# Patient Record
Sex: Female | Born: 1960
Health system: Southern US, Community
[De-identification: ages and names within clinical notes are randomized; demographics above are authoritative.]

## PROBLEM LIST (undated history)

## (undated) DIAGNOSIS — O021 Missed abortion: Secondary | ICD-10-CM

## (undated) DIAGNOSIS — Z923 Personal history of irradiation: Secondary | ICD-10-CM

## (undated) DIAGNOSIS — O009 Unspecified ectopic pregnancy without intrauterine pregnancy: Secondary | ICD-10-CM

## (undated) DIAGNOSIS — I10 Essential (primary) hypertension: Secondary | ICD-10-CM

## (undated) DIAGNOSIS — E079 Disorder of thyroid, unspecified: Secondary | ICD-10-CM

## (undated) DIAGNOSIS — E349 Endocrine disorder, unspecified: Secondary | ICD-10-CM

## (undated) DIAGNOSIS — K219 Gastro-esophageal reflux disease without esophagitis: Secondary | ICD-10-CM

## (undated) DIAGNOSIS — IMO0001 Reserved for inherently not codable concepts without codable children: Secondary | ICD-10-CM

## (undated) HISTORY — DX: Reserved for inherently not codable concepts without codable children: IMO0001

## (undated) HISTORY — PX: BREAST LUMPECTOMY: SHX2

## (undated) HISTORY — DX: Endocrine disorder, unspecified: E34.9

## (undated) HISTORY — DX: Gastro-esophageal reflux disease without esophagitis: K21.9

## (undated) HISTORY — PX: TUBAL LIGATION: SHX77

## (undated) HISTORY — DX: Unspecified ectopic pregnancy without intrauterine pregnancy: O00.90

## (undated) HISTORY — DX: Disorder of thyroid, unspecified: E07.9

## (undated) HISTORY — DX: Essential (primary) hypertension: I10

## (undated) HISTORY — PX: DILATION AND CURETTAGE OF UTERUS: SHX78

## (undated) HISTORY — DX: Missed abortion: O02.1

---

## 1985-01-02 HISTORY — PX: SALPINGECTOMY: SHX328

## 1986-01-02 HISTORY — PX: ENTEROLYSIS: SUR452

## 2001-01-22 ENCOUNTER — Encounter: Admission: RE | Admit: 2001-01-22 | Discharge: 2001-01-22 | Payer: Self-pay | Admitting: Family Medicine

## 2001-01-22 ENCOUNTER — Encounter: Payer: Self-pay | Admitting: Family Medicine

## 2002-04-22 ENCOUNTER — Other Ambulatory Visit: Admission: RE | Admit: 2002-04-22 | Discharge: 2002-04-22 | Payer: Self-pay | Admitting: Gynecology

## 2002-07-16 ENCOUNTER — Encounter: Admission: RE | Admit: 2002-07-16 | Discharge: 2002-10-14 | Payer: Self-pay | Admitting: Gynecology

## 2002-11-25 ENCOUNTER — Inpatient Hospital Stay (HOSPITAL_COMMUNITY): Admission: AD | Admit: 2002-11-25 | Discharge: 2002-11-25 | Payer: Self-pay | Admitting: Gynecology

## 2002-11-28 ENCOUNTER — Inpatient Hospital Stay (HOSPITAL_COMMUNITY): Admission: AD | Admit: 2002-11-28 | Discharge: 2002-11-28 | Payer: Self-pay | Admitting: Gynecology

## 2002-12-11 ENCOUNTER — Inpatient Hospital Stay (HOSPITAL_COMMUNITY): Admission: RE | Admit: 2002-12-11 | Discharge: 2002-12-14 | Payer: Self-pay | Admitting: Gynecology

## 2002-12-11 ENCOUNTER — Encounter (INDEPENDENT_AMBULATORY_CARE_PROVIDER_SITE_OTHER): Payer: Self-pay | Admitting: Specialist

## 2002-12-15 ENCOUNTER — Encounter: Admission: RE | Admit: 2002-12-15 | Discharge: 2003-01-14 | Payer: Self-pay | Admitting: Gynecology

## 2003-01-15 ENCOUNTER — Encounter: Admission: RE | Admit: 2003-01-15 | Discharge: 2003-02-14 | Payer: Self-pay | Admitting: Gynecology

## 2003-01-21 ENCOUNTER — Other Ambulatory Visit: Admission: RE | Admit: 2003-01-21 | Discharge: 2003-01-21 | Payer: Self-pay | Admitting: Gynecology

## 2003-02-15 ENCOUNTER — Encounter: Admission: RE | Admit: 2003-02-15 | Discharge: 2003-03-17 | Payer: Self-pay | Admitting: Gynecology

## 2003-04-15 ENCOUNTER — Encounter: Admission: RE | Admit: 2003-04-15 | Discharge: 2003-05-15 | Payer: Self-pay | Admitting: Gynecology

## 2003-06-15 ENCOUNTER — Encounter: Admission: RE | Admit: 2003-06-15 | Discharge: 2003-07-15 | Payer: Self-pay | Admitting: Gynecology

## 2003-08-15 ENCOUNTER — Encounter: Admission: RE | Admit: 2003-08-15 | Discharge: 2003-09-14 | Payer: Self-pay | Admitting: Gynecology

## 2003-09-15 ENCOUNTER — Encounter: Admission: RE | Admit: 2003-09-15 | Discharge: 2003-10-15 | Payer: Self-pay | Admitting: Gynecology

## 2005-07-31 ENCOUNTER — Other Ambulatory Visit: Admission: RE | Admit: 2005-07-31 | Discharge: 2005-07-31 | Payer: Self-pay | Admitting: Gynecology

## 2006-04-04 ENCOUNTER — Encounter: Admission: RE | Admit: 2006-04-04 | Discharge: 2006-04-04 | Payer: Self-pay | Admitting: Family Medicine

## 2007-05-13 ENCOUNTER — Encounter: Admission: RE | Admit: 2007-05-13 | Discharge: 2007-05-13 | Payer: Self-pay | Admitting: Family Medicine

## 2009-03-03 ENCOUNTER — Other Ambulatory Visit: Admission: RE | Admit: 2009-03-03 | Discharge: 2009-03-03 | Payer: Self-pay | Admitting: Gynecology

## 2009-03-03 ENCOUNTER — Ambulatory Visit: Payer: Self-pay | Admitting: Gynecology

## 2010-05-20 NOTE — Discharge Summary (Signed)
NAME:  Margaret Best, Margaret Best                    ACCOUNT NO.:  [id number]   MEDICAL RECORD NO.:  [id number]                   PATIENT TYPE:  INP   LOCATION:  9147                                 [facility]ILITY[facility]   PHYSICIAN:  Juan H. Lily Peer, M.D.             DATE OF BIRTH:  07/03/1960   DATE OF ADMISSION:  12/11/2002  DATE OF DISCHARGE:  12/14/2002                                 DISCHARGE SUMMARY   DISCHARGE DIAGNOSES:  1. Intrauterine pregnancy 37 weeks, delivered.  2. History of two prior cesarean sections for repeat cesarean section.  3. History of preeclampsia with abruptio placenta at 38 weeks with     intrauterine fetal demise.  4. Hypothyroidism.  5. Pregnancy induced hypertension.  6. Desired attempt at permanent sterilization.  7. Status post repeat low transverse cesarean section, right tubal     sterilization, modified Pomeroy technique by Dr. Colin Broach on     December 11, 2002.   HISTORY:  [name], para 1 (lost one female at 79  weeks) whose prenatal course has been complicated by history of preeclampsia  with the first pregnancy and abruptio placenta with a fetal demise at 45  weeks.  Subsequently, a successful pregnancy and also this pregnancy  developed pregnancy induced hypertension with blood pressure in the 140-  150/80-90 range.  She had a history of two prior cesarean sections for  repeat cesarean section.  Also desired attempt at permanent sterilization.  Note, past history of an ectopic pregnancy and history of hypothyroidism.  The patient had been followed in pregnancy with antepartum testing, biweekly  NSTs, and weekly AFIs.   HOSPITAL COURSE:  On December 11, 2002 patient was admitted.  Underwent a  repeat low transverse cesarean section, right tubal sterilization, modified  Pomeroy technique and underwent delivery of a female, Apgars 8 and 9, weight 7  pounds 6 ounces.  There were no complications.  There was noted to be absent  left fallopian tube.  Postoperatively patient remained afebrile, voiding,  stable condition.  She was discharged to home on December 14, 2002 and given  [facility] Gynecology postpartum instruction/postpartum booklet.   ACCESSORY CLINICAL FINDINGS:  Laboratories:  The patient is O+.  Rubella  immune.  On December 12, 2002 hemoglobin was 11.2.   DISPOSITION:  The patient was discharged to home.  Return to office six  weeks.  Given prescription for Tylox p.r.n. pain.     Susa Loffler, P.A.                    Juan H. Lily Peer, M.D.    TSG/MEDQ  D:  01/12/2003  T:  01/12/2003  Job:  [id number]

## 2010-05-20 NOTE — Op Note (Signed)
NAME:  Margaret Best, Margaret Best                    ACCOUNT NO.:  [id number]   MEDICAL RECORD NO.:  [id number]                   PATIENT TYPE:  MAT   LOCATION:  [facility]C                                 [facility]:  [facility]   PHYSICIAN:  Timothy P. Fontaine, M.D.           DATE OF BIRTH:  May 15, 1960   DATE OF PROCEDURE:  12/11/2002  DATE OF DISCHARGE:  11/25/2002                                 OPERATIVE REPORT   PREOPERATIVE DIAGNOSES:  1. Pregnancy at 37 weeks.  2. Pregnancy-induced hypertension.  3. History of fetal demise at term.  4. Cesarean section x2, desires repeat cesarean section.  5. Desires permanent sterilization.   POSTOPERATIVE DIAGNOSES:  1. Pregnancy at 37 weeks.  2. Pregnancy-induced hypertension.  3. History of fetal demise at term.  4. Cesarean section x2, desires repeat cesarean section.  5. Desires permanent sterilization.   PROCEDURES:  1. Repeat low transverse cervical cesarean section.  2. Right tubal sterilization, modified Pomeroy technique.   SURGEON:  Timothy P. Fontaine, M.D.   ASSISTANT:  Ivor Costa. Farrel Gobble, M.D.   ANESTHESIA:  Spinal.   ESTIMATED BLOOD LOSS:  Less than 500 mL.   COMPLICATIONS:  None.   SPECIMENS:  1. Portion of right fallopian tube.  2. Placenta.  3. Samples of cord blood.   FINDINGS:  At 1215 normal female infant, Apgars 8 and 9, weight 7 pounds 6  ounces.  Pelvic anatomy noted to be normal with the exception of an absent  left fallopian tube.   DESCRIPTION OF PROCEDURE:  The patient was taken to the operating room and  underwent spinal anesthesia, was placed in the left tilt supine position,  and received an abdominal preparation with Betadine solution, bladder  emptied with indwelling Foley catheterization placed in sterile technique.  After assuring adequate anesthesia, the abdomen was sharply entered through  a repeat Pfannenstiel incision, achieving adequate hemostasis at all levels.  The bladder flap was sharply and bluntly  developed without difficulty and  the uterus was sharply entered in the lower uterine segment with bulging  membrane noted.  The incision bluntly extended laterally and the membranes  ruptured.  The infant's head was delivered through the incision, he nares  and mouth suctioned, the rest of the infant delivered, the cord doubly  clamped and cut, the infant handed to pediatrics in attendance.  Samples of  cord blood were then obtained.  The placenta was then spontaneously extruded  and noted to be intact.  The uterus was exteriorized and the endometrial  cavity was explored with a sponge to remove all placental and membrane  fragments.  The uterine incision was closed in one layer using 0 Vicryl  suture in a running interlocking stitch.  Two figure-of-eight interrupted  sutures were placed to achieve ultimate hemostasis along the suture line.  The adnexa were then examined.  The right and left ovaries were noted to be  normal.  There was no evidence of a left fallopian  tube.  The right  fallopian tube was normal in appearance, and a midsection was grasped with  Babcock clamp and the mid-tubal segment was doubly ligated using 0 plain  suture, the intervening segment excised.  The tubal lumen as well as  adequate hemostasis was grossly visualized.  The uterus was returned to the  abdomen, which was copiously irrigated showing adequate hemostasis, and the  anterior fascia was reapproximated using 0 Vicryl suture in a running  stitch.  The subcutaneous tissues were irrigated showing adequate hemostasis  with electrocautery, and the skin was reapproximated using 4-0 Vicryl in a  running subcuticular stitch.  Steri-Strips and Benzoin applied, a sterile  dressing applied.  The patient was taken to the recovery room in good  condition, having tolerated the procedure well.  Of note, the patient's  linea nigra did not approximate at the incision line, which was noted  preoperatively from the prior  surgeries and initial attempts to  reapproximate the linea would have distorted the incision significantly, and  the incision was closed as it was found with the linea malapproximated.                                               Timothy P. Audie Box, M.D.    TPF/MEDQ  D:  12/11/2002  T:  12/11/2002  Job:  [id number]

## 2010-05-20 NOTE — H&P (Signed)
NAME:  Margaret Best, Margaret Best                    ACCOUNT NO.:  [id number]   MEDICAL RECORD NO.:  [id number]                   PATIENT TYPE:  INP   LOCATION:  NA                                   [facility]   PHYSICIAN:  Timothy P. Fontaine, M.D.           DATE OF BIRTH:  September 30, 1960   DATE OF ADMISSION:  12/11/2002  DATE OF DISCHARGE:                                HISTORY & PHYSICAL   CHIEF COMPLAINT:  1. Pregnancy at 37 weeks.  2. Two prior cesarean sections, for repeat cesarean section.  3. History of pre-eclampsia with abruptio placentae at 38 weeks with     intrauterine fetal demise.  4. Hypothyroidism.  5. Pregnancy induced hypertension.  6. Desires permanent sterilization.   HISTORY OF PRESENT ILLNESS:  A 50 year old G5, P1, lost 1 female at 81  weeks. History of pre-eclampsia with her first pregnancy and abruptio  placenta, leading to fetal demise at 38 weeks. Subsequent successful  pregnancy who now is at 37 weeks. Has developed pregnancy induced  hypertension with blood pressure's in the 140 to 150 over 80 to 90 range,  who is admitted at this time for a repeat cesarean section and tubal  sterilization. The patient had done well through the pregnancy. Was noted to  have elevated blood pressures at approximately 28 weeks, which have  continued through term with recordings in the 130 over 100 range, rest  leading to 140's over 80's to 90's. Has undergone antepartum testing with bi-  weekly NST's, weekly AFI's, who now is admitted for her delivery. See the  [facility] for the remaining of her history.   PHYSICAL EXAMINATION:  HEENT:  Normal.  LUNGS:  Clear.  CARDIAC:  Regular rate without rubs, murmurs or gallops.  ABDOMEN:  Gravid, vertex fetus. Appropriate for term. Positive fetal heart  tones. Reactive NST.  PELVIC:  Deferred.   ASSESSMENT:  A 50 year old patient with history of severe pre-eclampsia with  abruptio placentae at term with fetal loss. Subsequent  success at 38 weeks  with repeat cesarean section who is now admitted for repeat cesarean section  and tubal sterilization. The risks, benefits, indications and alternatives  for the procedure were reviewed with her and I discussed the bleeding,  transfusion, infection, wound complications requiring opening and draining  of incisions, closure by secondary intention, damage to internal organs  including bowel, bladder, ureters, vessels and nerves and necessitating  major exploratory reparative surgeries and future reparative surgeries  including ostomy formation. The permanency of tubal sterilization was  discussed with her as well as the possibilities of failure and she  understands and accepts this. I also discussed with her that she is at 37  weeks and the pros and cons of continued antepartum management with  antepartum surveillance versus cesarean section now were reviewed and given  her past history, I think it most reasonable to proceed now with delivery.  The patient does understand that there still can be  issues with prematurity.  She understands and accepts this but wants to proceed with surgery now. The  patient's questions are answered to her satisfaction and she is ready to  proceed with surgery.                                              Timothy P. Audie Box, M.D.   TPF/MEDQ  D:  12/09/2002  T:  12/09/2002  Job:  [id number]

## 2010-11-30 ENCOUNTER — Other Ambulatory Visit: Payer: Self-pay | Admitting: Plastic Surgery

## 2012-01-25 ENCOUNTER — Ambulatory Visit (INDEPENDENT_AMBULATORY_CARE_PROVIDER_SITE_OTHER): Payer: BC Managed Care – PPO | Admitting: Gynecology

## 2012-01-25 ENCOUNTER — Encounter: Payer: Self-pay | Admitting: Gynecology

## 2012-01-25 VITALS — BP 136/92

## 2012-01-25 DIAGNOSIS — I10 Essential (primary) hypertension: Secondary | ICD-10-CM | POA: Insufficient documentation

## 2012-01-25 DIAGNOSIS — N951 Menopausal and female climacteric states: Secondary | ICD-10-CM

## 2012-01-25 DIAGNOSIS — N938 Other specified abnormal uterine and vaginal bleeding: Secondary | ICD-10-CM | POA: Insufficient documentation

## 2012-01-25 DIAGNOSIS — N949 Unspecified condition associated with female genital organs and menstrual cycle: Secondary | ICD-10-CM

## 2012-01-25 DIAGNOSIS — E039 Hypothyroidism, unspecified: Secondary | ICD-10-CM | POA: Insufficient documentation

## 2012-01-25 LAB — FOLLICLE STIMULATING HORMONE: FSH: 8.1 m[IU]/mL

## 2012-01-25 LAB — CBC WITH DIFFERENTIAL/PLATELET
Basophils Absolute: 0 10*3/uL (ref 0.0–0.1)
Basophils Relative: 0 % (ref 0–1)
Eosinophils Absolute: 0 10*3/uL (ref 0.0–0.7)
Eosinophils Relative: 1 % (ref 0–5)
HCT: 44.6 % (ref 36.0–46.0)
Hemoglobin: 15.3 g/dL — ABNORMAL HIGH (ref 12.0–15.0)
Lymphocytes Relative: 22 % (ref 12–46)
Lymphs Abs: 1.6 10*3/uL (ref 0.7–4.0)
MCH: 30.7 pg (ref 26.0–34.0)
MCHC: 34.3 g/dL (ref 30.0–36.0)
MCV: 89.6 fL (ref 78.0–100.0)
Monocytes Absolute: 0.4 10*3/uL (ref 0.1–1.0)
Monocytes Relative: 5 % (ref 3–12)
Neutro Abs: 5.2 10*3/uL (ref 1.7–7.7)
Neutrophils Relative %: 72 % (ref 43–77)
Platelets: 298 10*3/uL (ref 150–400)
RBC: 4.98 MIL/uL (ref 3.87–5.11)
RDW: 13.3 % (ref 11.5–15.5)
WBC: 7.2 10*3/uL (ref 4.0–10.5)

## 2012-01-25 LAB — TSH: TSH: 1.815 u[IU]/mL (ref 0.350–4.500)

## 2012-01-25 MED ORDER — MEGESTROL ACETATE 40 MG PO TABS: 40.0000 mg | ORAL_TABLET | Freq: Two times a day (BID) | ORAL | Status: DC

## 2012-01-25 NOTE — Patient Instructions (Addendum)

## 2012-01-25 NOTE — Progress Notes (Signed)
Patient is a 52 year old gravida 3 para 2 Ab1 (tubal sterilization procedure) who has not been seen the office since 2011. The reason for today's visit is to do her recent dysfunction uterine bleeding episodes. She stated her last normal menstrual cycle was in August 2013. She stated she skipped the month of September. She had a normal cycle in October and November although the November cycle was light. She had some minimal spotting in December when she had a vaginal infection. She had a light period started at the end of December which ended January 6. 10 days later and up to today she has been bleeding every day. Patient does have history of hypertension and hypothyroidism and is being followed by Dr. Duane Lope at [facility]. Her last thyroid function tests were in March of 2013. Patient currently denies any other symptoms.  Review of her record indicated she has had in the past 8 history of left ectopic pregnancy. She also had toxemia (intrauterine fetal demise at 8-1/2 months gestation for/IUGR) resulting in cesarean section. She had a repeat cesarean section at term in 1993. She's also had a history of miscarriage first trimester as well. Her records also indicated in 1988 she had small bowel obstruction and had enterolysis. She also had an ectopic pregnancy resulted in a left salpingectomy in 1987. She had her tubes tied at the last C-section.  Exam: Abdomen: Soft nontender no rebound or guarding Pelvic: Bartholin urethra Skene was within normal limits Vagina: Blood was present in the vaginal vault Cervix: Blood was seen at the external os Uterus: Anteverted normal size shape and consistency Adnexa: No palpable masses or tenderness Rectal exam not done  Patient was counseled for an endometrial biopsy to obtain tissue specimen for histological evaluation as part of her workup. The cervix was cleansed with Betadine solution. A single-tooth tenaculum was then placed on the anterior  cervical lip. The uterus sounded to 7-1/2 cm. A Pipelle was introduced sterilely and moderate amount of tissue was obtained for pathological evaluation. The single-tooth tenaculum was removed. Silver nitrate was utilized to stop some light bleeding from the single-tooth tenaculum prong site.  Assessment/plan: Perimenopausal patient with dysfunctional uterine bleeding. Endometrial biopsy done today resulting in time of this dictation. Patient will be prescribed Megace 40 mg twice a day for 10 days to stop her bleeding. She will return back to the office next week for sonohysterogram to complete evaluation. A CBC, FSH and TSH was obtained today.

## 2012-02-02 ENCOUNTER — Ambulatory Visit (INDEPENDENT_AMBULATORY_CARE_PROVIDER_SITE_OTHER): Payer: BC Managed Care – PPO

## 2012-02-02 ENCOUNTER — Ambulatory Visit (INDEPENDENT_AMBULATORY_CARE_PROVIDER_SITE_OTHER): Payer: BC Managed Care – PPO | Admitting: Gynecology

## 2012-02-02 DIAGNOSIS — N949 Unspecified condition associated with female genital organs and menstrual cycle: Secondary | ICD-10-CM

## 2012-02-02 DIAGNOSIS — N951 Menopausal and female climacteric states: Secondary | ICD-10-CM

## 2012-02-02 DIAGNOSIS — N938 Other specified abnormal uterine and vaginal bleeding: Secondary | ICD-10-CM

## 2012-02-02 NOTE — Progress Notes (Signed)
Patient is a 52 year old gravida 3 para 2 AB 1 (prior tubal sterilization procedure) who was seen the office on 01/25/2012 as a result of dysfunction uterine bleeding see previous dictation office visit for details. Patient is not having any vasomotor symptoms there was having menstrual cycles are regular until recently as described on last office note. She underwent an endometrial biopsy at that visit with the following result:  Endometrium, biopsy PROLIFERATIVE-TYPE ENDOMETRIUM WITH BREAKDOWN.  She had a normal CBC a normal TSH and a normal FSH.  She presented to the office today for sonohysterogram. Sonohysterogram results as follows: Uterus measured 9.1 x 6.1 x 4.1 cm with endometrial stripe of 4.3 mm. A single small fibroid measured 13 x 9 mm was noted. Ovaries were normal bilateral. No fluid was seen in the cul-de-sac. Sonohysterogram with no evidence of any intrauterine defects.  Assessment/plan: Single isolated event of dysfunction uterine bleeding. Patient with history of hypothyroidism was recently tested and was found to be euthyroid with normal TSH. (Patient on Synthroid 75 mcg daily). We'll check her prolactin level today. She will maintain menstrual calendar for the next 3 months and return to the office the first week of May for her annual gynecological examination. She will come in a fasting state and we'll do her lab work to include a repeat FSH. Patient several months before this dysfunctional uterine bleeding episode was under a lot of stress. I reassured her that we can continue to monitor and rediscuss and monitor her between now and her annual exam in 4 months.

## 2012-02-02 NOTE — Patient Instructions (Addendum)
Perimenopause Perimenopause is the time when your body begins to move into the menopause (no menstrual period for 12 straight months). It is a natural process. Perimenopause can begin 2 to 8 years before the menopause and usually lasts for one year after the menopause. During this time, your ovaries may or may not produce an egg. The ovaries vary in their production of estrogen and progesterone hormones each month. This can cause irregular menstrual periods, difficulty in getting pregnant, vaginal bleeding between periods and uncomfortable symptoms. CAUSES  Irregular production of the ovarian hormones, estrogen and progesterone, and not ovulating every month.  Other causes include:  Tumor of the pituitary gland in the brain.  Medical disease that affects the ovaries.  Radiation treatment.  Chemotherapy.  Unknown causes.  Heavy smoking and excessive alcohol intake can bring on perimenopause sooner. SYMPTOMS   Hot flashes.  Night sweats.  Irregular menstrual periods.  Decrease sex drive.  Vaginal dryness.  Headaches.  Mood swings.  Depression.  Memory problems.  Irritability.  Tiredness.  Weight gain.  Trouble getting pregnant.  The beginning of losing bone cells (osteoporosis).  The beginning of hardening of the arteries (atherosclerosis). DIAGNOSIS  Your caregiver will make a diagnosis by analyzing your age, menstrual history and your symptoms. They will do a physical exam noting any changes in your body, especially your female organs. Female hormone tests may or may not be helpful depending on the amount and when you produce the female hormones. However, other hormone tests may be helpful (ex. thyroid hormone) to rule out other problems. TREATMENT  The decision to treat during the perimenopause should be made by you and your caregiver depending on how the symptoms are affecting you and your life style. There are various treatments available such as:  Treating  individual symptoms with a specific medication for that symptom (ex. tranquilizer for depression).  Herbal medications that can help specific symptoms.  Counseling.  Group therapy.  No treatment. HOME CARE INSTRUCTIONS   Before seeing your caregiver, make a list of your menstrual periods (when the occur, how heavy they are, how long between periods and how long they last), your symptoms and when they started.  Take the medication as recommended by your caregiver.  Sleep and rest.  Exercise.  Eat a diet that contains calcium (good for your bones) and soy (acts like estrogen hormone).  Do not smoke.  Avoid alcoholic beverages.  Taking vitamin E may help in certain cases.  Take calcium and vitamin D supplements to help prevent bone loss.  Group therapy is sometimes helpful.  Acupuncture may help in some cases. SEEK MEDICAL CARE IF:   You have any of the above and want to know if it is perimenopause.  You want advice and treatment for any of your symptoms mentioned above.  You need a referral to a specialist (gynecologist, psychiatrist or psychologist). SEEK IMMEDIATE MEDICAL CARE IF:   You have vaginal bleeding.  Your period lasts longer than 8 days.  You periods are recurring sooner than 21 days.  You have bleeding after intercourse.  You have severe depression.  You have pain when you urinate.  You have severe headaches.  You develop vision problems. Document Released: 01/27/2004 Document Revised: 03/13/2011 Document Reviewed: 10/17/2007 [employer] Patient Information 2013 [employer].  

## 2012-02-03 LAB — PROLACTIN: Prolactin: 10.4 ng/mL

## 2012-02-17 ENCOUNTER — Other Ambulatory Visit: Payer: Self-pay

## 2012-05-06 ENCOUNTER — Encounter: Payer: BC Managed Care – PPO | Admitting: Gynecology

## 2012-11-07 ENCOUNTER — Other Ambulatory Visit: Payer: Self-pay

## 2013-01-31 ENCOUNTER — Encounter: Payer: BC Managed Care – PPO | Admitting: Gynecology

## 2013-11-03 ENCOUNTER — Encounter: Payer: Self-pay | Admitting: Gynecology

## 2015-07-08 ENCOUNTER — Encounter: Payer: Self-pay | Admitting: Gynecology

## 2015-07-08 ENCOUNTER — Ambulatory Visit (INDEPENDENT_AMBULATORY_CARE_PROVIDER_SITE_OTHER): Payer: BLUE CROSS/BLUE SHIELD | Admitting: Gynecology

## 2015-07-08 VITALS — BP 136/84 | Ht 63.5 in | Wt 168.0 lb

## 2015-07-08 DIAGNOSIS — R6882 Decreased libido: Secondary | ICD-10-CM | POA: Diagnosis not present

## 2015-07-08 DIAGNOSIS — N950 Postmenopausal bleeding: Secondary | ICD-10-CM

## 2015-07-08 DIAGNOSIS — Z01419 Encounter for gynecological examination (general) (routine) without abnormal findings: Secondary | ICD-10-CM

## 2015-07-08 NOTE — Progress Notes (Signed)
Margaret Best 1960-09-21 [id number]   History:    55 y.o.  for annual gyn exam who has not been seen the office in over 3 years. Patient stated she has not had a menstrual cycle in over 2 years. Is not having any vasomotor symptoms except decreased libido. She does suffer from vaginal dryness and discomfort during intercourse. She states that every 3 or 4 month she'll have some brownish discharge and she's noted vaginally like dark blood. She has not seen anyone for this. Patient with no past history of any abnormal Pap smears. Patient has never had a colonoscopy and states that she rather not have one. Patient states that her primary care physician in [place]. [place], [place] has recently done her blood work is been monitor her thyroid since she has history of hypothyroidism and stated that he he had done an [facility] and she was in the menopausal range. She has never been on hormone replacement therapy.  Past medical history,surgical history, family history and social history were all reviewed and documented in the EPIC chart.  Gynecologic History Patient's last menstrual period was 01/20/2012. Contraception: post menopausal status Last Pap: 2011. Results were: normal Last mammogram: 2009. Results were: normal  Obstetric History OB History  Gravida Para Term Preterm AB SAB TAB Ectopic Multiple Living  3 2   1   1  2     # Outcome Date GA Lbr Len/2nd Weight Sex Delivery Anes PTL Lv  3 Ectopic           2 Para           1 Para                ROS: A ROS was performed and pertinent positives and negatives are included in the history.  GENERAL: No fevers or chills. HEENT: No change in vision, no earache, sore throat or sinus congestion. NECK: No pain or stiffness. CARDIOVASCULAR: No chest pain or pressure. No palpitations. PULMONARY: No shortness of breath, cough or wheeze. GASTROINTESTINAL: No abdominal pain, nausea, vomiting or diarrhea, melena or bright red blood per rectum. GENITOURINARY:  No urinary frequency, urgency, hesitancy or dysuria. MUSCULOSKELETAL: No joint or muscle pain, no back pain, no recent trauma. DERMATOLOGIC: No rash, no itching, no lesions. ENDOCRINE: No polyuria, polydipsia, no heat or cold intolerance. No recent change in weight. HEMATOLOGICAL: No anemia or easy bruising or bleeding. NEUROLOGIC: No headache, seizures, numbness, tingling or weakness. PSYCHIATRIC: No depression, no loss of interest in normal activity or change in sleep pattern.     Exam: chaperone present  BP 136/84 mmHg  Ht 5' 3.5" (1.613 m)  Wt 168 lb (76.204 kg)  BMI 29.29 kg/m2  LMP 01/20/2012  Body mass index is 29.29 kg/(m^2).  General appearance : Well developed well nourished female. No acute distress HEENT: Eyes: no retinal hemorrhage or exudates,  Neck supple, trachea midline, no carotid bruits, no thyroidmegaly Lungs: Clear to auscultation, no rhonchi or wheezes, or rib retractions  Heart: Regular rate and rhythm, no murmurs or gallops Breast:Examined in sitting and supine position were symmetrical in appearance, no palpable masses or tenderness,  no skin retraction, no nipple inversion, no nipple discharge, no skin discoloration, no axillary or supraclavicular lymphadenopathy Abdomen: no palpable masses or tenderness, no rebound or guarding Extremities: no edema or skin discoloration or tenderness  Pelvic:  Bartholin, Urethra, Skene Glands: Within normal limits             Vagina: No gross lesions  or discharge  Cervix: No gross lesions or discharge  Uterus  anteverted, normal size, shape and consistency, non-tender and mobile  Adnexa  Without masses or tenderness  Anus and perineum  normal   Rectovaginal  normal sphincter tone without palpated masses or tenderness             Hemoccult cards will be provided as well as recommend have a colonoscopy     Assessment/Plan:  55 y.o. female for annual exam with postmenopausal bleeding complaining of vaginal dryness and  dyspareunia and decreased libido. We are going to request a copy of the labs were done recently by her PCP. Pap smear with HPV screening was done today. Because of her postmenopausal bleeding she was counseled and her cervix was cleansed with Betadine solution. A single-tooth tenaculum was placed on the anterior cervical lip. The cervix required dilatation. The uterus sounded to 7 cm. Some tissue was obtained which was made of histological evaluation. Patient will return to the office next week for sonohysterogram as part of her evaluation for postmenopausal bleeding. Literature information on hormonal replacement therapy and the menopause was provided. I've also given the name of gastric urologist her to schedule her colonoscopy as well as her mammogram.   Terrance Mass MD, 4:26 PM 07/08/2015

## 2015-07-08 NOTE — Patient Instructions (Signed)
Hormone Therapy At menopause, your body begins making less estrogen and progesterone hormones. This causes the body to stop having menstrual periods. This is because estrogen and progesterone hormones control your periods and menstrual cycle. A lack of estrogen may cause symptoms such as:  Hot flushes (or hot flashes).  Vaginal dryness.  Dry skin.  Loss of sex drive.  Risk of bone loss (osteoporosis). When this happens, you may choose to take hormone therapy to get back the estrogen lost during menopause. When the hormone estrogen is given alone, it is usually referred to as ET (Estrogen Therapy). When the hormone progestin is combined with estrogen, it is generally called HT (Hormone Therapy). This was formerly known as hormone replacement therapy (HRT). Your caregiver can help you make a decision on what will be best for you. The decision to use HT seems to change often as new studies are done. Many studies do not agree on the benefits of hormone replacement therapy. LIKELY BENEFITS OF HT INCLUDE PROTECTION FROM:  Hot Flushes (also called hot flashes) - A hot flush is a sudden feeling of heat that spreads over the face and body. The skin may redden like a blush. It is connected with sweats and sleep disturbance. Women going through menopause may have hot flushes a few times a month or several times per day depending on the woman.  Osteoporosis (bone loss) - Estrogen helps guard against bone loss. After menopause, a woman's bones slowly lose calcium and become weak and brittle. As a result, bones are more likely to break. The hip, wrist, and spine are affected most often. Hormone therapy can help slow bone loss after menopause. Weight bearing exercise and taking calcium with vitamin D also can help prevent bone loss. There are also medications that your caregiver can prescribe that can help prevent osteoporosis.  Vaginal dryness - Loss of estrogen causes changes in the vagina. Its lining may  become thin and dry. These changes can cause pain and bleeding during sexual intercourse. Dryness can also lead to infections. This can cause burning and itching. (Vaginal estrogen treatment can help relieve pain, itching, and dryness.)  Urinary tract infections are more common after menopause because of lack of estrogen. Some women also develop urinary incontinence because of low estrogen levels in the vagina and bladder.  Possible other benefits of estrogen include a positive effect on mood and short-term memory in women. RISKS AND COMPLICATIONS  Using estrogen alone without progesterone causes the lining of the uterus to grow. This increases the risk of lining of the uterus (endometrial) cancer. Your caregiver should give another hormone called progestin if you have a uterus.  Women who take combined (estrogen and progestin) HT appear to have an increased risk of breast cancer. The risk appears to be small, but increases throughout the time that HT is taken.  Combined therapy also makes the breast tissue slightly denser which makes it harder to read mammograms (breast X-rays).  Combined, estrogen and progesterone therapy can be taken together every day, in which case there may be spotting of blood. HT therapy can be taken cyclically in which case you will have menstrual periods. Cyclically means HT is taken for a set amount of days, then not taken, then this process is repeated.  HT may increase the risk of stroke, heart attack, breast cancer and forming blood clots in your leg.  Transdermal estrogen (estrogen that is absorbed through the skin with a patch or a cream) may have better results with:  Cholesterol.  Blood pressure.  Blood clots. Having the following conditions may indicate you should not have HT:  Endometrial cancer.  Liver disease.  Breast cancer.  Heart disease.  History of blood clots.  Stroke. TREATMENT   If you choose to take HT and have a uterus, usually  estrogen and progestin are prescribed.  Your caregiver will help you decide the best way to take the medications.  Possible ways to take estrogen include:  Pills.  Patches.  Gels.  Sprays.  Vaginal estrogen cream, rings and tablets.  It is best to take the lowest dose possible that will help your symptoms and take them for the shortest period of time that you can.  Hormone therapy can help relieve some of the problems (symptoms) that affect women at menopause. Before making a decision about HT, talk to your caregiver about what is best for you. Be well informed and comfortable with your decisions. HOME CARE INSTRUCTIONS   Follow your caregivers advice when taking the medications.  A Pap test is done to screen for cervical cancer.  The first Pap test should be done at age 34.  Between ages 80 and 52, Pap tests are repeated every 2 years.  Beginning at age 13, you are advised to have a Pap test every 3 years as long as the past 3 Pap tests have been normal.  Some women have medical problems that increase the chance of getting cervical cancer. Talk to your caregiver about these problems. It is especially important to talk to your caregiver if a new problem develops soon after your last Pap test. In these cases, your caregiver may recommend more frequent screening and Pap tests.  The above recommendations are the same for women who have or have not gotten the vaccine for HPV (human papillomavirus).  If you had a hysterectomy for a problem that was not a cancer or a condition that could lead to cancer, then you no longer need Pap tests. However, even if you no longer need a Pap test, a regular exam is a good idea to make sure no other problems are starting.  If you are between ages 20 and 60, and you have had normal Pap tests going back 10 years, you no longer need Pap tests. However, even if you no longer need a Pap test, a regular exam is a good idea to make sure no other problems  are starting.  If you have had past treatment for cervical cancer or a condition that could lead to cancer, you need Pap tests and screening for cancer for at least 20 years after your treatment.  If Pap tests have been discontinued, risk factors (such as a new sexual partner)need to be re-assessed to determine if screening should be resumed.  Some women may need screenings more often if they are at high risk for cervical cancer.  Get mammograms done as per the advice of your caregiver. SEEK IMMEDIATE MEDICAL CARE IF:  You develop abnormal vaginal bleeding.  You have pain or swelling in your legs, shortness of breath, or chest pain.  You develop dizziness or headaches.  You have lumps or changes in your breasts or armpits.  You have slurred speech.  You develop weakness or numbness of your arms or legs.  You have pain, burning, or bleeding when urinating.  You develop abdominal pain.   This information is not intended to replace advice given to you by your health care provider. Make sure you discuss any questions  you have with your health care provider.   Document Released: 09/17/2002 Document Revised: 05/05/2014 Document Reviewed: 06/22/2014 Elsevier Interactive Patient Education 2016 [place] POST-PROCEDURE INSTRUCTIONS  1. You may take Ibuprofen, Aleve or Tylenol for pain if needed.  Cramping should resolve within in 24 hours.  2. You may have a small amount of spotting.  You should wear a mini pad for the next few days.  3. You may have intercourse after 24 hours.  4. You need to call if you have any pelvic pain, fever, heavy bleeding or foul smelling vaginal discharge.  5. Shower or bathe as normal  6. We will call you within one week with results or we will discuss   the results at your follow-up appointment if needed.

## 2015-07-12 ENCOUNTER — Telehealth: Payer: Self-pay | Admitting: Gynecology

## 2015-07-12 ENCOUNTER — Other Ambulatory Visit: Payer: Self-pay | Admitting: Gynecology

## 2015-07-12 DIAGNOSIS — N950 Postmenopausal bleeding: Secondary | ICD-10-CM

## 2015-07-12 LAB — PAP, TP IMAGING W/ HPV RNA, RFLX HPV TYPE 16,18/45: HPV MRNA, HIGH RISK: NOT DETECTED

## 2015-07-12 NOTE — Telephone Encounter (Signed)
07/12/15-Pt advised today that her [facility] will cover the sonohysterogram and pos bx with the $25 copay. No precert needed. Per Jen@BC -[id number]

## 2015-07-22 ENCOUNTER — Ambulatory Visit (INDEPENDENT_AMBULATORY_CARE_PROVIDER_SITE_OTHER): Payer: BLUE CROSS/BLUE SHIELD

## 2015-07-22 ENCOUNTER — Ambulatory Visit (INDEPENDENT_AMBULATORY_CARE_PROVIDER_SITE_OTHER): Payer: BLUE CROSS/BLUE SHIELD | Admitting: Gynecology

## 2015-07-22 ENCOUNTER — Other Ambulatory Visit: Payer: Self-pay | Admitting: Gynecology

## 2015-07-22 ENCOUNTER — Encounter: Payer: Self-pay | Admitting: Gynecology

## 2015-07-22 DIAGNOSIS — D251 Intramural leiomyoma of uterus: Secondary | ICD-10-CM

## 2015-07-22 DIAGNOSIS — N950 Postmenopausal bleeding: Secondary | ICD-10-CM

## 2015-07-22 DIAGNOSIS — Z79890 Hormone replacement therapy (postmenopausal): Secondary | ICD-10-CM

## 2015-07-22 DIAGNOSIS — N856 Intrauterine synechiae: Secondary | ICD-10-CM

## 2015-07-22 DIAGNOSIS — N952 Postmenopausal atrophic vaginitis: Secondary | ICD-10-CM

## 2015-07-22 MED ORDER — ESTRADIOL 10 MCG VA TABS: 1.0000 | ORAL_TABLET | VAGINAL | Status: DC

## 2015-07-22 NOTE — Progress Notes (Signed)
   Patient is a 55 year old that presented to the office today for sonohysterogram as further evaluation of her slight postmenopausal brownish discharge vaginally. She was seen the office for her annual exam for the first time in 3 years early this month. At that office visit she had the following performed:  Endometrial biopsy: Diagnosis Endometrium, biopsy, uterus - MOSTLY FRAGMENTED PROLIFERATIVE ENDOMETRIUM WITH SMALL FOCI OF BREAKDOWN.  Pap smear: Normal  She stated that her primary care physician in [facility] had done all her blood work and that her [facility] was in the menopausal range.  Ultrasound today: Uterus measured 8.8 x 6.0 x 4.3 cm with endometrial stripe 1.5 mm. Small anterior intramural fibroid measured 15 x 10 mm. Right ovary was normal. Left ovary small follicle 14 mm echo-free. No fluid in the cul-de-sac. The cervix is then cleansed with Betadine solution and a sterile catheter was introduced into the uterine cavity normal saline was instilled and no defects were noted except a small and a medial synechia.  Assessment/plan: Patient slight brownish discharge that she makes para is may have been an atrophic bleed. Her uterus today demonstrated endometrial stripe was 1.5 mm. She is on no hormone replacement therapy. For her vaginal atrophy and discomfort since she's menopausal and point prescribed Vagifem 10 g to apply intravaginally twice a week. Risk benefits and pros and cons were discussed and literature information was provided. Patient was reassured. Discussed importance of calcium vitamin D and weightbearing exercise for osteoporosis prevention as well.

## 2015-07-22 NOTE — Patient Instructions (Signed)
Estradiol vaginal tablets What is this medicine? ESTRADIOL (es tra DYE ole) vaginal tablet is used to help relieve symptoms of vaginal irritation and dryness that occurs in some women during menopause. This medicine may be used for other purposes; ask your health care provider or pharmacist if you have questions. What should I tell my health care provider before I take this medicine? They need to know if you have any of these conditions: -abnormal vaginal bleeding -blood vessel disease or blood clots -breast, cervical, endometrial, ovarian, liver, or uterine cancer -dementia -diabetes -gallbladder disease -heart disease or recent heart attack -high blood pressure -high cholesterol -high level of calcium in the blood -hysterectomy -kidney disease -liver disease -migraine headaches -protein C deficiency -protein S deficiency -stroke -systemic lupus erythematosus (SLE) -tobacco smoker -an unusual or allergic reaction to estrogens, other hormones, medicines, foods, dyes, or preservatives -pregnant or trying to get pregnant -breast-feeding How should I use this medicine? This medicine is only for use in the vagina. Do not take by mouth. Wash and dry your hands before and after use. Read package directions carefully. Unwrap the applicator package. Be sure to use a new applicator for each dose. Use at the same time each day. If the tablet has fallen out of the applicator, but is still in the package, carefully place it back into the applicator. If the tablet has fallen out of the package, that applicator should be thrown out and you should use a new applicator containing a new tablet. Lie on your back, part and bend your knees. Gently insert the applicator as far as comfortably possible into the vagina. Then, gently press the plunger until the plunger is fully depressed. This will release the tablet into the vagina. Gently remove the applicator. Throw away the applicator after use. Do not use  your medicine more often than directed. Do not stop using except on the advice of your doctor or health care professional. Talk to your pediatrician regarding the use of this medicine in children. This medicine is not approved for use in children. A patient package insert for the product will be given with each prescription and refill. Read this sheet carefully each time. The sheet may change frequently. Overdosage: If you think you have taken too much of this medicine contact a poison control center or emergency room at once. NOTE: This medicine is only for you. Do not share this medicine with others. What if I miss a dose? If you miss a dose, take it as soon as you can. If it is almost time for your next dose, take only that dose. Do not take double or extra doses. What may interact with this medicine? Do not take this medicine with any of the following medications: -aromatase inhibitors like aminoglutethimide, anastrozole, exemestane, letrozole, testolactone This medicine may also interact with the following medications: -antibiotics used to treat tuberculosis like rifabutin, rifampin and rifapentene -raloxifene or tamoxifen -warfarin This list may not describe all possible interactions. Give your health care provider a list of all the medicines, herbs, non-prescription drugs, or dietary supplements you use. Also tell them if you smoke, drink alcohol, or use illegal drugs. Some items may interact with your medicine. What should I watch for while using this medicine? Visit your health care professional for regular checks on your progress. You will need a regular breast and pelvic exam. You should also discuss the need for regular mammograms with your health care professional, and follow his or her guidelines. This medicine can make  your body retain fluid, making your fingers, hands, or ankles swell. Your blood pressure can go up. Contact your doctor or health care professional if you feel you are  retaining fluid. If you have any reason to think you are pregnant; stop taking this medicine at once and contact your doctor or health care professional. Tobacco smoking increases the risk of getting a blood clot or having a stroke, especially if you are more than 55 years old. You are strongly advised not to smoke. If you wear contact lenses and notice visual changes, or if the lenses begin to feel uncomfortable, consult your eye care specialist. If you are going to have elective surgery, you may need to stop taking this medicine beforehand. Consult your health care professional for advice prior to scheduling the surgery. What side effects may I notice from receiving this medicine? Side effects that you should report to your doctor or health care professional as soon as possible: -allergic reactions like skin rash, itching or hives, swelling of the face, lips, or tongue -breast tissue changes or discharge -changes in vision -chest pain -confusion, trouble speaking or understanding -dark urine -general ill feeling or flu-like symptoms -light-colored stools -nausea, vomiting -pain, swelling, warmth in the leg -right upper belly pain -severe headaches -shortness of breath -sudden numbness or weakness of the face, arm or leg -trouble walking, dizziness, loss of balance or coordination -unusual vaginal bleeding -yellowing of the eyes or skin Side effects that usually do not require medical attention (report to your doctor or health care professional if they continue or are bothersome): -hair loss -increased hunger or thirst -increased urination -symptoms of vaginal infection like itching, irritation or unusual discharge -unusually weak or tired This list may not describe all possible side effects. Call your doctor for medical advice about side effects. You may report side effects to FDA at 1-800-FDA-1088. Where should I keep my medicine? Keep out of the reach of children. Store at room  temperature between 15 and 30 degrees C (59 and 86 degrees F). Throw away any unused medicine after the expiration date. NOTE: This sheet is a summary. It may not cover all possible information. If you have questions about this medicine, talk to your doctor, pharmacist, or health care provider.    2016, Elsevier/Gold Standard. (2013-12-03 09:22:51)

## 2016-05-17 ENCOUNTER — Encounter: Payer: Self-pay | Admitting: Gynecology

## 2017-07-26 ENCOUNTER — Encounter: Payer: BLUE CROSS/BLUE SHIELD | Admitting: Gynecology

## 2017-09-06 ENCOUNTER — Encounter: Payer: Self-pay | Admitting: Gynecology

## 2017-09-06 ENCOUNTER — Ambulatory Visit: Payer: BLUE CROSS/BLUE SHIELD | Admitting: Gynecology

## 2017-09-06 VITALS — BP 138/84 | Ht 64.0 in | Wt 153.0 lb

## 2017-09-06 DIAGNOSIS — Z01419 Encounter for gynecological examination (general) (routine) without abnormal findings: Secondary | ICD-10-CM | POA: Diagnosis not present

## 2017-09-06 DIAGNOSIS — N952 Postmenopausal atrophic vaginitis: Secondary | ICD-10-CM | POA: Diagnosis not present

## 2017-09-06 NOTE — Progress Notes (Signed)
    Margaret Best 1960/12/03 [id number]        57 y.o.  [id number] for annual gynecologic exam.  Former patient of Dr. Toney Rakes.  Doing well without complaints.  Past medical history,surgical history, problem list, medications, allergies, family history and social history were all reviewed and documented as reviewed in the EPIC chart.  ROS:  Performed with pertinent positives and negatives included in the history, assessment and plan.   Additional significant findings : None   Exam: Margaret Best assistant Vitals:   09/06/17 1514  BP: 138/84  Weight: 153 lb (69.4 kg)  Height: 5\' 4"  (1.626 m)   Body mass index is 26.26 kg/m.  General appearance:  Normal affect, orientation and appearance. Skin: Grossly normal HEENT: Without gross lesions.  No cervical or supraclavicular adenopathy. Thyroid normal.  Lungs:  Clear without wheezing, rales or rhonchi Cardiac: RR, without RMG Abdominal:  Soft, nontender, without masses, guarding, rebound, organomegaly or hernia Breasts:  Examined lying and sitting without masses, retractions, discharge or axillary adenopathy. Pelvic:  Ext, BUS, Vagina: With atrophic changes  Cervix: With atrophic changes  Uterus: Anteverted, normal size, shape and contour, midline and mobile nontender   Adnexa: Without masses or tenderness    Anus and perineum: Normal   Rectovaginal: Normal sphincter tone without palpated masses or tenderness.    Assessment/Plan:  57 y.o. [id number] female for annual gynecologic exam.   1. Postmenopausal/atrophic genital changes.  No significant menopausal symptoms or any vaginal bleeding. 2. Mammography many years ago.  I reviewed breast cancer is the most common cancer in women.  Benefits of early detection reviewed.  Strongly recommended patient schedule a screening mammogram.  Names and numbers provided.  Breast exam normal today. 3. Colonoscopy never.  I reviewed colon cancer second most common cancer in women.  Benefits of  precancerous polyp and early cancerous polyp removal.  Strongly recommended patient schedule screening colonoscopy.  Names and numbers provided. 4. Pap smear/HPV 2017.  No Pap smear done today.  No history of significant abnormal Pap smears.  Plan repeat Pap smear/HPV at 5-year interval per current screening guidelines. 5. DEXA never.  Will plan at age 63. 37. Health maintenance.  No routine lab work done as patient reports this done elsewhere.  Follow-up 1 year, sooner as needed.   Anastasio Auerbach MD, 3:38 PM 09/06/2017

## 2017-09-06 NOTE — Patient Instructions (Addendum)
Schedule your colonoscopy with either:  [facility]   Address: [address], [place], [place] [address]  [phone]    or  [facility]  Address: [address], [place], [place] [address]  [phone]       Call to Schedule your mammogram  Facilities in [place]: 1)  [facility], [address] Phone: [phone] 2)  Dr. Bertrand at [facility]  [address] Phone: [phone]     Mammogram A mammogram is an X-ray test to find changes in a woman's breast. You should get a mammogram if:  You are 40 years of age or older  You have risk factors.   Your doctor recommends that you have one.  BEFORE THE TEST  Do not schedule the test the week before your period, especially if your breasts are sore during this time.  On the day of your mammogram:  Wash your breasts and armpits well. After washing, do not put on any deodorant or talcum powder on until after your test.   Eat and drink as you usually do.   Take your medicines as usual.   If you are diabetic and take insulin, make sure you:   Eat before coming for your test.   Take your insulin as usual.   If you cannot keep your appointment, call before the appointment to cancel. Schedule another appointment.  TEST  You will need to undress from the waist up. You will put on a hospital gown.   Your breast will be put on the mammogram machine, and it will press firmly on your breast with a piece of plastic called a compression paddle. This will make your breast flatter so that the machine can X-ray all parts of your breast.   Both breasts will be X-rayed. Each breast will be X-rayed from above and from the side. An X-ray might need to be taken again if the picture is not good enough.   The mammogram will last about 15 to 30 minutes.  AFTER THE TEST Finding out the results of your test Ask when  your test results will be ready. Make sure you get your test results.  Document Released: 03/17/2008 Document Revised: 12/08/2010 Document Reviewed: 03/17/2008 [employer] Patient Information 2012 [employer].   

## 2017-12-18 ENCOUNTER — Emergency Department (HOSPITAL_BASED_OUTPATIENT_CLINIC_OR_DEPARTMENT_OTHER): Payer: BLUE CROSS/BLUE SHIELD

## 2017-12-18 ENCOUNTER — Emergency Department (HOSPITAL_BASED_OUTPATIENT_CLINIC_OR_DEPARTMENT_OTHER)
Admission: EM | Admit: 2017-12-18 | Discharge: 2017-12-18 | Disposition: A | Discharge status: Home / Self Care | Payer: BLUE CROSS/BLUE SHIELD | Attending: Emergency Medicine | Admitting: Emergency Medicine

## 2017-12-18 ENCOUNTER — Encounter (HOSPITAL_BASED_OUTPATIENT_CLINIC_OR_DEPARTMENT_OTHER): Payer: Self-pay | Admitting: *Deleted

## 2017-12-18 ENCOUNTER — Other Ambulatory Visit: Payer: Self-pay

## 2017-12-18 DIAGNOSIS — M25511 Pain in right shoulder: Secondary | ICD-10-CM

## 2017-12-18 DIAGNOSIS — M25512 Pain in left shoulder: Secondary | ICD-10-CM | POA: Insufficient documentation

## 2017-12-18 DIAGNOSIS — R072 Precordial pain: Secondary | ICD-10-CM | POA: Insufficient documentation

## 2017-12-18 DIAGNOSIS — I10 Essential (primary) hypertension: Secondary | ICD-10-CM | POA: Diagnosis not present

## 2017-12-18 DIAGNOSIS — R0789 Other chest pain: Secondary | ICD-10-CM | POA: Diagnosis present

## 2017-12-18 DIAGNOSIS — E039 Hypothyroidism, unspecified: Secondary | ICD-10-CM | POA: Diagnosis not present

## 2017-12-18 LAB — CBC WITH DIFFERENTIAL/PLATELET
Abs Immature Granulocytes: 0.03 10*3/uL (ref 0.00–0.07)
Basophils Absolute: 0 10*3/uL (ref 0.0–0.1)
Basophils Relative: 0 %
Eosinophils Absolute: 0 10*3/uL (ref 0.0–0.5)
Eosinophils Relative: 0 %
HCT: 40.5 % (ref 36.0–46.0)
Hemoglobin: 13.1 g/dL (ref 12.0–15.0)
Immature Granulocytes: 0 %
Lymphocytes Relative: 20 %
Lymphs Abs: 2.6 10*3/uL (ref 0.7–4.0)
MCH: 31 pg (ref 26.0–34.0)
MCHC: 32.3 g/dL (ref 30.0–36.0)
MCV: 95.7 fL (ref 80.0–100.0)
Monocytes Absolute: 1.2 10*3/uL — ABNORMAL HIGH (ref 0.1–1.0)
Monocytes Relative: 9 %
NRBC: 0 % (ref 0.0–0.2)
Neutro Abs: 9.1 10*3/uL — ABNORMAL HIGH (ref 1.7–7.7)
Neutrophils Relative %: 71 %
Platelets: 241 10*3/uL (ref 150–400)
RBC: 4.23 MIL/uL (ref 3.87–5.11)
RDW: 12.8 % (ref 11.5–15.5)
WBC: 13 10*3/uL — ABNORMAL HIGH (ref 4.0–10.5)

## 2017-12-18 LAB — BASIC METABOLIC PANEL
Anion gap: 8 (ref 5–15)
BUN: 17 mg/dL (ref 6–20)
CALCIUM: 9.2 mg/dL (ref 8.9–10.3)
CO2: 26 mmol/L (ref 22–32)
Chloride: 103 mmol/L (ref 98–111)
Creatinine, Ser: 0.78 mg/dL (ref 0.44–1.00)
GFR calc Af Amer: >60 mL/min (ref >60)
GFR calc non Af Amer: >60 mL/min (ref >60)
Glucose, Bld: 95 mg/dL (ref 70–99)
Potassium: 3.4 mmol/L — ABNORMAL LOW (ref 3.5–5.1)
Sodium: 137 mmol/L (ref 135–145)

## 2017-12-18 LAB — TROPONIN I: Troponin I: <0.03 ng/mL (ref <0.03)

## 2017-12-18 MED ORDER — METHOCARBAMOL 500 MG PO TABS: 500.0000 mg | ORAL_TABLET | Freq: Three times a day (TID) | ORAL | 0 refills | Status: DC | PRN

## 2017-12-18 NOTE — ED Notes (Signed)
Patient transported to X-ray 

## 2017-12-18 NOTE — ED Provider Notes (Signed)
Emergency Department Provider Note   I have reviewed the triage vital signs and the nursing notes.   HISTORY  Chief Complaint No chief complaint on file.   HPI Margaret Best is a 57 y.o. female with PMH of HTN presents to the emergency department for evaluation of tightness and pain over her shoulders and back with new radiation to the chest.  Patient states that she was doing a lot of lifting and moving for a recent party and felt stiffness shortly afterwards.  This morning she developed continued stiffness but had radiation of pain to the anterior chest.  Pain is worse with movement.  She tried Tylenol and Motrin with no relief in symptoms so became more concerned regarding her chest pain.  She denies any fevers or chills.  No shortness of breath.  No prior history of similar pain.  Mom had a heart attack which was fatal when she was 30.  Past Medical History:  Diagnosis Date  . Ectopic pregnancy    left  . Hormone disorder    HYPOTHYROIDISM  . Hypertension   . Missed ab    FIRST TRIMESTER  . Toxemia     Patient Active Problem List   Diagnosis Date Noted  . Libido, decreased 07/08/2015  . PMB (postmenopausal bleeding) 07/08/2015  . HTN (hypertension) 01/25/2012  . Hypothyroidism 01/25/2012    Past Surgical History:  Procedure Laterality Date  . CESAREAN SECTION    . DILATION AND CURETTAGE OF UTERUS    . ENTEROLYSIS  1988   SBO  . SALPINGECTOMY  1987   left  . TUBAL LIGATION     Allergies Patient has no known allergies.  Family History  Problem Relation Age of Onset  . Hypertension Mother     Social History Social History   Tobacco Use  . Smoking status: Never Smoker  . Smokeless tobacco: Never Used  Substance Use Topics  . Alcohol use: Yes    Alcohol/week: 0.0 standard drinks    Comment: OCC  . Drug use: No    Review of Systems  Constitutional: No fever/chills Eyes: No visual changes. ENT: No sore throat. Cardiovascular: Positive chest  pain. Respiratory: Denies shortness of breath. Gastrointestinal: No abdominal pain.  No nausea, no vomiting.  No diarrhea.  No constipation. Genitourinary: Negative for dysuria. Musculoskeletal: Negative for back pain. Positive shoulder pain bilaterally.  Skin: Negative for rash. Neurological: Negative for headaches, focal weakness or numbness.  10-point ROS otherwise negative.  ____________________________________________   PHYSICAL EXAM:  VITAL SIGNS: Vitals:   12/18/17 1649  BP: 136/78  Pulse: 72  Resp: 17  SpO2: 99%     Constitutional: Alert and oriented. Well appearing and in no acute distress. Eyes: Conjunctivae are normal.  Head: Atraumatic. Nose: No congestion/rhinnorhea. Mouth/Throat: Mucous membranes are moist.  Neck: No stridor. Cardiovascular: Normal rate, regular rhythm. Good peripheral circulation. Grossly normal heart sounds.   Respiratory: Normal respiratory effort.  No retractions. Lungs CTAB. Gastrointestinal: Soft and nontender. No distention.  Musculoskeletal: No lower extremity tenderness nor edema. No gross deformities of extremities. Tenderness to palpation of the bilateral deltoids and trapezius.  Neurologic:  Normal speech and language. No gross focal neurologic deficits are appreciated.  Skin:  Skin is warm, dry and intact. No rash noted.  ____________________________________________   LABS (all labs ordered are listed, but only abnormal results are displayed)  Labs Reviewed  BASIC METABOLIC PANEL - Abnormal; Notable for the following components:      Result Value  Potassium 3.4 (*)    All other components within normal limits  CBC WITH DIFFERENTIAL/PLATELET - Abnormal; Notable for the following components:   WBC 13.0 (*)    Neutro Abs 9.1 (*)    Monocytes Absolute 1.2 (*)    All other components within normal limits  TROPONIN I   ____________________________________________  EKG   EKG Interpretation  Date/Time:  Tuesday December 18 2017 14:58:26 EST Ventricular Rate:  102 PR Interval:    QRS Duration: 92 QT Interval:  349 QTC Calculation: 455 R Axis:   54 Text Interpretation:  Sinus tachycardia Probable left atrial enlargement No STEMI.  Confirmed by Nanda Quinton [phone]) on 12/18/2017 3:23:01 PM       ____________________________________________  RADIOLOGY  Dg Chest 2 View  Result Date: 12/18/2017 CLINICAL DATA:  Onset of chest pain this morning. History of hypertension. EXAM: CHEST - 2 VIEW COMPARISON:  None. FINDINGS: The lungs are adequately inflated. There is no focal infiltrate. There is no pleural effusion or pneumothorax. The heart and pulmonary vascularity are normal. The mediastinum is normal in width. There is mild multilevel degenerative disc disease of the thoracic spine. IMPRESSION: There is no active cardiopulmonary disease. Electronically Signed   By: David  Martinique M.D.   On: 12/18/2017 16:08    ____________________________________________   PROCEDURES  Procedure(s) performed:   Procedures  None ____________________________________________   INITIAL IMPRESSION / ASSESSMENT AND PLAN / ED COURSE  Pertinent labs & imaging results that were available during my care of the patient were reviewed by me and considered in my medical decision making (see chart for details).  Presents to the emergency department with shoulder, back, chest discomfort.  Symptoms began after moving and lifting heavy objects.  Pain is reproducible with touching but chest pain developed today.  It is been constant since this morning.  Very low suspicion for ACS or PE but plan for screening labs including troponin.  Exam and history not consistent with PE.   Labs and CXR reviewed. No acute findings. Plan for muscle relaxer, NSAIDs, heat, and PCP follow up. Discussed ED return precautions.  ____________________________________________  FINAL CLINICAL IMPRESSION(S) / ED DIAGNOSES  Final diagnoses:  Precordial  chest pain  Acute pain of both shoulders    NEW OUTPATIENT MEDICATIONS STARTED DURING THIS VISIT:  Discharge Medication List as of 12/18/2017  4:29 PM    START taking these medications   Details  methocarbamol (ROBAXIN) 500 MG tablet Take 1 tablet (500 mg total) by mouth every 8 (eight) hours as needed for muscle spasms., Starting Tue 12/18/2017, Print        Note:  This document was prepared using Dragon voice recognition software and may include unintentional dictation errors.  Nanda Quinton, MD Emergency Medicine    Kailia Starry, [facility], MD 12/19/17 [phone]

## 2017-12-18 NOTE — Discharge Instructions (Signed)

## 2017-12-18 NOTE — ED Notes (Addendum)
Pt states she felt like she pulled muscle in back on Sunday when picking up a case of water. States she is now having left and right chest pain with pain increasing when pressure applied. She states pain increases with pressure, or when coughing or sneezing. Pt denies nausea or vomiting, but states she "feels tired". She had "a cold sweat" yesterday morning, but denies any diaphoresis at this time.

## 2017-12-18 NOTE — ED Triage Notes (Signed)
A few days ago she was lifting something heavy. She felt like she pulled a muscle and had pain in her upper back and right shoulder. This am the pain was across her shoulders, her neck and anterior chest. She is ambulatory.

## 2018-02-28 DIAGNOSIS — I998 Other disorder of circulatory system: Secondary | ICD-10-CM | POA: Insufficient documentation

## 2018-07-03 DIAGNOSIS — F4320 Adjustment disorder, unspecified: Secondary | ICD-10-CM | POA: Diagnosis not present

## 2018-07-19 DIAGNOSIS — F4320 Adjustment disorder, unspecified: Secondary | ICD-10-CM | POA: Diagnosis not present

## 2018-07-25 DIAGNOSIS — M79672 Pain in left foot: Secondary | ICD-10-CM | POA: Diagnosis not present

## 2018-08-29 DIAGNOSIS — M79672 Pain in left foot: Secondary | ICD-10-CM | POA: Diagnosis not present

## 2018-08-30 DIAGNOSIS — F4320 Adjustment disorder, unspecified: Secondary | ICD-10-CM | POA: Diagnosis not present

## 2018-09-05 DIAGNOSIS — I10 Essential (primary) hypertension: Secondary | ICD-10-CM | POA: Diagnosis not present

## 2018-09-05 DIAGNOSIS — E039 Hypothyroidism, unspecified: Secondary | ICD-10-CM | POA: Diagnosis not present

## 2018-09-05 DIAGNOSIS — R7303 Prediabetes: Secondary | ICD-10-CM | POA: Diagnosis not present

## 2018-09-05 DIAGNOSIS — R05 Cough: Secondary | ICD-10-CM | POA: Diagnosis not present

## 2018-09-06 ENCOUNTER — Other Ambulatory Visit: Payer: Self-pay

## 2018-09-06 DIAGNOSIS — F4320 Adjustment disorder, unspecified: Secondary | ICD-10-CM | POA: Diagnosis not present

## 2018-09-10 ENCOUNTER — Encounter: Payer: Self-pay | Admitting: Gynecology

## 2018-09-10 ENCOUNTER — Other Ambulatory Visit: Payer: Self-pay

## 2018-09-10 ENCOUNTER — Ambulatory Visit (INDEPENDENT_AMBULATORY_CARE_PROVIDER_SITE_OTHER): Payer: BC Managed Care – PPO | Admitting: Gynecology

## 2018-09-10 VITALS — BP 130/86 | Ht 63.5 in | Wt 181.0 lb

## 2018-09-10 DIAGNOSIS — Z01419 Encounter for gynecological examination (general) (routine) without abnormal findings: Secondary | ICD-10-CM | POA: Diagnosis not present

## 2018-09-10 DIAGNOSIS — N952 Postmenopausal atrophic vaginitis: Secondary | ICD-10-CM | POA: Diagnosis not present

## 2018-09-10 NOTE — Progress Notes (Signed)
    Margaret Best [birth year] [id number]        59 y.o.  [id number] for annual gynecologic exam.  Without gynecologic complaints  Past medical history,surgical history, problem list, medications, allergies, family history and social history were all reviewed and documented as reviewed in the EPIC chart.  ROS:  Performed with pertinent positives and negatives included in the history, assessment and plan.   Additional significant findings : None   Exam: Copywriter, advertising Vitals:   09/10/18 1614  BP: 130/86  Weight: 181 lb (82.1 kg)  Height: 5' 3.5" (1.613 m)   Body mass index is 31.56 kg/m.  General appearance:  Normal affect, orientation and appearance. Skin: Grossly normal HEENT: Without gross lesions.  No cervical or supraclavicular adenopathy. Thyroid normal.  Lungs:  Clear without wheezing, rales or rhonchi Cardiac: RR, without RMG Abdominal:  Soft, nontender, without masses, guarding, rebound, organomegaly or hernia Breasts:  Examined lying and sitting without masses, retractions, discharge or axillary adenopathy. Pelvic:  Ext, BUS, Vagina: With atrophic changes  Cervix: With atrophic changes  Uterus: Anteverted, normal size, shape and contour, midline and mobile nontender   Adnexa: Without masses or tenderness    Anus and perineum: Normal   Rectovaginal: Normal sphincter tone without palpated masses or tenderness.    Assessment/Plan:  58 y.o. [id number] female for annual gynecologic exam.   1. Postmenopausal.  No significant menopausal symptoms or any vaginal bleeding. 2. Colonoscopy never.  I again strongly recommended the patient consider a screening colonoscopy.  Second most common cancer in women reviewed.  Benefits of precancerous polyp removal and early detection reviewed.  Patient acknowledges my recommendations.  Names and numbers provided. 3. Mammography 5+ years ago.  I again strongly recommended the patient schedule a screening mammogram.  Most common cancer in  women reviewed.  Benefits of early detection discussed.  Names and numbers provided.  Patient acknowledges my recommendations.  Breast exam normal today. 4. Pap smear/HPV 2017.  No Pap smear done today.  No history of abnormal Pap smears previously.  Plan repeat Pap smear/HPV at 5-year interval per current screening guidelines. 5. DEXA never.  Plan at age 73. 20. Health maintenance.  Mildly elevated blood pressure at 130/86 discussed.  States that this actually is good for her as she has whitecoat hypertension.  She follows her blood pressure at home which is normal.  She will continue to monitor this and follow-up with her primary provider if elevated.  No routine blood work done as patient does this elsewhere.  Follow-up 1 year, sooner as needed.   Anastasio Auerbach MD, 4:30 PM 09/10/2018

## 2018-09-10 NOTE — Patient Instructions (Signed)
Schedule your colonoscopy with either:  [facility]   Address: [address], [place], [place] [address]  [phone]    or  [facility]  Address: [address], [place], [place] [address]  [phone]       Call to Schedule your mammogram  Facilities in [place]: 1)  [facility]. [facility], [address] Phone: [phone] 2)  Dr. Bertrand at [facility]  [address] Phone: [phone]     Mammogram A mammogram is an X-ray test to find changes in a woman's breast. You should get a mammogram if:  You are 40 years of age or older  You have risk factors.   Your doctor recommends that you have one.  BEFORE THE TEST  Do not schedule the test the week before your period, especially if your breasts are sore during this time.  On the day of your mammogram:  Wash your breasts and armpits well. After washing, do not put on any deodorant or talcum powder on until after your test.   Eat and drink as you usually do.   Take your medicines as usual.   If you are diabetic and take insulin, make sure you:   Eat before coming for your test.   Take your insulin as usual.   If you cannot keep your appointment, call before the appointment to cancel. Schedule another appointment.  TEST  You will need to undress from the waist up. You will put on a hospital gown.   Your breast will be put on the mammogram machine, and it will press firmly on your breast with a piece of plastic called a compression paddle. This will make your breast flatter so that the machine can X-ray all parts of your breast.   Both breasts will be X-rayed. Each breast will be X-rayed from above and from the side. An X-ray might need to be taken again if the picture is not good enough.   The mammogram will last about 15 to 30 minutes.  AFTER THE TEST Finding out the results of your test Ask when  your test results will be ready. Make sure you get your test results.  Document Released: 03/17/2008 Document Revised: 12/08/2010 Document Reviewed: 03/17/2008 Exit[employer] Patient Information 2012 [employer].   

## 2018-09-25 DIAGNOSIS — K219 Gastro-esophageal reflux disease without esophagitis: Secondary | ICD-10-CM | POA: Diagnosis not present

## 2018-09-25 DIAGNOSIS — R05 Cough: Secondary | ICD-10-CM | POA: Diagnosis not present

## 2018-09-26 DIAGNOSIS — F4320 Adjustment disorder, unspecified: Secondary | ICD-10-CM | POA: Diagnosis not present

## 2018-10-01 ENCOUNTER — Encounter: Payer: Self-pay | Admitting: Gynecology

## 2018-10-03 DIAGNOSIS — M79672 Pain in left foot: Secondary | ICD-10-CM | POA: Diagnosis not present

## 2018-10-04 DIAGNOSIS — F4320 Adjustment disorder, unspecified: Secondary | ICD-10-CM | POA: Diagnosis not present

## 2018-10-10 DIAGNOSIS — M79672 Pain in left foot: Secondary | ICD-10-CM | POA: Diagnosis not present

## 2018-12-05 DIAGNOSIS — F4322 Adjustment disorder with anxiety: Secondary | ICD-10-CM | POA: Diagnosis not present

## 2018-12-18 DIAGNOSIS — H43391 Other vitreous opacities, right eye: Secondary | ICD-10-CM | POA: Diagnosis not present

## 2018-12-18 DIAGNOSIS — H43311 Vitreous membranes and strands, right eye: Secondary | ICD-10-CM | POA: Diagnosis not present

## 2019-01-16 DIAGNOSIS — F4320 Adjustment disorder, unspecified: Secondary | ICD-10-CM | POA: Diagnosis not present

## 2019-01-23 DIAGNOSIS — H43391 Other vitreous opacities, right eye: Secondary | ICD-10-CM | POA: Diagnosis not present

## 2019-01-23 DIAGNOSIS — H43311 Vitreous membranes and strands, right eye: Secondary | ICD-10-CM | POA: Diagnosis not present

## 2019-01-27 DIAGNOSIS — M79672 Pain in left foot: Secondary | ICD-10-CM | POA: Diagnosis not present

## 2019-02-13 DIAGNOSIS — F4320 Adjustment disorder, unspecified: Secondary | ICD-10-CM | POA: Diagnosis not present

## 2019-02-14 DIAGNOSIS — M79672 Pain in left foot: Secondary | ICD-10-CM | POA: Diagnosis not present

## 2019-03-06 DIAGNOSIS — E559 Vitamin D deficiency, unspecified: Secondary | ICD-10-CM | POA: Diagnosis not present

## 2019-03-06 DIAGNOSIS — E039 Hypothyroidism, unspecified: Secondary | ICD-10-CM | POA: Diagnosis not present

## 2019-03-06 DIAGNOSIS — R000 Tachycardia, unspecified: Secondary | ICD-10-CM | POA: Diagnosis not present

## 2019-03-06 DIAGNOSIS — I10 Essential (primary) hypertension: Secondary | ICD-10-CM | POA: Diagnosis not present

## 2019-03-06 DIAGNOSIS — R7303 Prediabetes: Secondary | ICD-10-CM | POA: Diagnosis not present

## 2019-03-06 DIAGNOSIS — R809 Proteinuria, unspecified: Secondary | ICD-10-CM | POA: Diagnosis not present

## 2019-03-06 DIAGNOSIS — Z0000 Encounter for general adult medical examination without abnormal findings: Secondary | ICD-10-CM | POA: Diagnosis not present

## 2019-03-06 DIAGNOSIS — I160 Hypertensive urgency: Secondary | ICD-10-CM | POA: Diagnosis not present

## 2019-03-07 DIAGNOSIS — M79672 Pain in left foot: Secondary | ICD-10-CM | POA: Diagnosis not present

## 2019-03-13 DIAGNOSIS — F4320 Adjustment disorder, unspecified: Secondary | ICD-10-CM | POA: Diagnosis not present

## 2019-03-17 DIAGNOSIS — M79672 Pain in left foot: Secondary | ICD-10-CM | POA: Diagnosis not present

## 2019-03-27 DIAGNOSIS — F4320 Adjustment disorder, unspecified: Secondary | ICD-10-CM | POA: Diagnosis not present

## 2019-04-02 DIAGNOSIS — M79672 Pain in left foot: Secondary | ICD-10-CM | POA: Diagnosis not present

## 2019-04-24 DIAGNOSIS — F4320 Adjustment disorder, unspecified: Secondary | ICD-10-CM | POA: Diagnosis not present

## 2019-04-28 DIAGNOSIS — M79672 Pain in left foot: Secondary | ICD-10-CM | POA: Diagnosis not present

## 2019-05-01 DIAGNOSIS — M79672 Pain in left foot: Secondary | ICD-10-CM | POA: Diagnosis not present

## 2019-05-12 DIAGNOSIS — M25562 Pain in left knee: Secondary | ICD-10-CM | POA: Diagnosis not present

## 2019-05-15 DIAGNOSIS — F4320 Adjustment disorder, unspecified: Secondary | ICD-10-CM | POA: Diagnosis not present

## 2019-05-21 DIAGNOSIS — M79672 Pain in left foot: Secondary | ICD-10-CM | POA: Diagnosis not present

## 2019-06-12 DIAGNOSIS — F4320 Adjustment disorder, unspecified: Secondary | ICD-10-CM | POA: Diagnosis not present

## 2019-06-24 DIAGNOSIS — I10 Essential (primary) hypertension: Secondary | ICD-10-CM | POA: Diagnosis not present

## 2019-06-24 DIAGNOSIS — R000 Tachycardia, unspecified: Secondary | ICD-10-CM | POA: Diagnosis not present

## 2019-06-24 DIAGNOSIS — Z23 Encounter for immunization: Secondary | ICD-10-CM | POA: Diagnosis not present

## 2019-06-24 DIAGNOSIS — E669 Obesity, unspecified: Secondary | ICD-10-CM | POA: Diagnosis not present

## 2019-06-26 DIAGNOSIS — M79672 Pain in left foot: Secondary | ICD-10-CM | POA: Diagnosis not present

## 2019-07-11 DIAGNOSIS — Z20822 Contact with and (suspected) exposure to covid-19: Secondary | ICD-10-CM | POA: Diagnosis not present

## 2019-07-11 DIAGNOSIS — Z03818 Encounter for observation for suspected exposure to other biological agents ruled out: Secondary | ICD-10-CM | POA: Diagnosis not present

## 2019-08-21 DIAGNOSIS — M79672 Pain in left foot: Secondary | ICD-10-CM | POA: Diagnosis not present

## 2019-09-12 ENCOUNTER — Encounter: Payer: BC Managed Care – PPO | Admitting: Obstetrics and Gynecology

## 2019-09-18 DIAGNOSIS — M79672 Pain in left foot: Secondary | ICD-10-CM | POA: Diagnosis not present

## 2019-10-24 ENCOUNTER — Encounter: Payer: Self-pay | Admitting: Obstetrics and Gynecology

## 2019-10-24 ENCOUNTER — Other Ambulatory Visit: Payer: Self-pay

## 2019-10-24 ENCOUNTER — Ambulatory Visit: Payer: BC Managed Care – PPO | Admitting: Obstetrics and Gynecology

## 2019-10-24 VITALS — BP 128/80 | Ht 63.0 in | Wt 178.0 lb

## 2019-10-24 DIAGNOSIS — N6322 Unspecified lump in the left breast, upper inner quadrant: Secondary | ICD-10-CM

## 2019-10-24 DIAGNOSIS — Z01419 Encounter for gynecological examination (general) (routine) without abnormal findings: Secondary | ICD-10-CM

## 2019-10-24 NOTE — Progress Notes (Signed)
   Margaret Best [birth year] [id number]  SUBJECTIVE:  59 y.o. [id number] female here for a breast and pelvic exam. She has no gynecologic concerns.  Current Outpatient Medications  Medication Sig Dispense Refill  . levothyroxine (SYNTHROID, LEVOTHROID) 75 MCG tablet Take 75 mcg by mouth daily.    Marland Kitchen losartan-hydrochlorothiazide (HYZAAR) 100-12.5 MG tablet Take 1 tablet by mouth daily.    . metoprolol succinate (TOPROL-XL) 50 MG 24 hr tablet Take 50 mg by mouth daily.     No current facility-administered medications for this visit.   Allergies: Patient has no known allergies.  Patient's last menstrual period was 01/20/2012.  Past medical history,surgical history, problem list, medications, allergies, family history and social history were all reviewed and documented as reviewed in the EPIC chart.  GYN ROS: no abnormal bleeding, pelvic pain or discharge, no breast pain or new or enlarging lumps on self exam.  No dysuria, urinary frequency, pain with urination, cloudy/malodorous urine.   OBJECTIVE:  BP 128/80 (BP Location: Right Arm, Patient Position: Sitting, Cuff Size: Normal)   Ht 5\' 3"  (1.6 m)   Wt 178 lb (80.7 kg)   LMP 01/20/2012   BMI 31.53 kg/m  The patient appears well, alert, oriented, in no distress.  BREAST EXAM: breasts appear normal, deeper densities palpated in left breast at 3:00 1/2 inch off of the areola and in the 10:00 area 1 inch off of areola.  No suspicious masses, no skin or nipple changes or axillary nodes  PELVIC EXAM: VULVA: normal appearing vulva with atrophic change, no masses, tenderness or lesions, VAGINA: normal appearing vagina with atrophic change, normal color and discharge, no lesions, CERVIX: normal appearing atrophic cervix without discharge or lesions, UTERUS: uterus is normal size, shape, consistency and nontender, ADNEXA: normal adnexa in size, nontender and no masses, RECTAL: normal rectal, no masses  Chaperone: KimAlexis Bonham present during  the examination  ASSESSMENT:  59 y.o. [id number] here for a breast and pelvic exam  PLAN:   1. Postmenopausal.  No significant hot flashes or night sweats.  No vaginal bleeding. 2. Pap smear/HPV 07/2015.  No significant history of abnormal Pap smears.  Next Pap smear due 2022 following the current guidelines recommending the 5 year interval. 3. Mammogram 2009.  Densities noted in left breast as above.  I cannot provide any additional reassurance to the patient as she has not had a mammogram in several years.  I recommend getting breast imaging and she would be willing to do a left breast ultrasound so I will have staff order this.  We discussed this may not be optimal imaging and mammogram may be needed if she accepts.  She understands that breast cancer is most common cancer diagnosed in women and detection is best achieved with a clinical breast exam and regular mammogram screening. 4. Colonoscopy never.  She states she plans to do Cologuard at her primary doctor. 5. DEXA never. Plan at age 59. 27. Health maintenance.  BP normal range today.  No labs today as she normally has these completed elsewhere.  Return annually or sooner, prn.  Joseph Pierini MD 10/24/19

## 2019-10-28 ENCOUNTER — Telehealth: Payer: Self-pay | Admitting: *Deleted

## 2019-10-28 NOTE — Telephone Encounter (Signed)
Patient called back and I explained to her that the breast center per protocol will need to schedule diag, mammogram and breast ultrasound, not only ultrasound. Patient said she will think about about diag, mammogram and call me back.

## 2019-10-28 NOTE — Telephone Encounter (Signed)
Left message for patient to call and discuss.

## 2019-10-28 NOTE — Telephone Encounter (Signed)
-----   Message from Joseph Pierini, MD sent at 10/24/2019  3:13 PM EDT ----- Regarding: Left breast ultrasound Patient does not do regular screening mammograms and does not want to.  On the exam today had noted some densities in her left breast and she said she would agree to doing ultrasound of the left breast.  Please help her schedule this.

## 2019-10-30 DIAGNOSIS — M79672 Pain in left foot: Secondary | ICD-10-CM | POA: Diagnosis not present

## 2019-11-06 DIAGNOSIS — I10 Essential (primary) hypertension: Secondary | ICD-10-CM | POA: Diagnosis not present

## 2019-11-06 DIAGNOSIS — I998 Other disorder of circulatory system: Secondary | ICD-10-CM | POA: Diagnosis not present

## 2019-11-13 DIAGNOSIS — M545 Low back pain: Secondary | ICD-10-CM | POA: Diagnosis not present

## 2019-11-13 DIAGNOSIS — S46812A Strain of other muscles, fascia and tendons at shoulder and upper arm level, left arm, initial encounter: Secondary | ICD-10-CM | POA: Diagnosis not present

## 2019-11-13 DIAGNOSIS — M79672 Pain in left foot: Secondary | ICD-10-CM | POA: Diagnosis not present

## 2019-11-18 DIAGNOSIS — Z1231 Encounter for screening mammogram for malignant neoplasm of breast: Secondary | ICD-10-CM | POA: Diagnosis not present

## 2019-12-04 DIAGNOSIS — I998 Other disorder of circulatory system: Secondary | ICD-10-CM | POA: Diagnosis not present

## 2019-12-04 DIAGNOSIS — R7989 Other specified abnormal findings of blood chemistry: Secondary | ICD-10-CM | POA: Diagnosis not present

## 2019-12-04 DIAGNOSIS — R7303 Prediabetes: Secondary | ICD-10-CM | POA: Diagnosis not present

## 2019-12-04 DIAGNOSIS — I10 Essential (primary) hypertension: Secondary | ICD-10-CM | POA: Diagnosis not present

## 2019-12-08 DIAGNOSIS — N6325 Unspecified lump in the left breast, overlapping quadrants: Secondary | ICD-10-CM | POA: Diagnosis not present

## 2020-02-06 HISTORY — PX: BREAST LUMPECTOMY: SHX2

## 2020-02-22 DIAGNOSIS — C50912 Malignant neoplasm of unspecified site of left female breast: Secondary | ICD-10-CM | POA: Insufficient documentation

## 2020-03-05 NOTE — Progress Notes (Signed)
Location of Breast Cancer: Malignant neoplasm of LEFT breast, estrogen receptor positive  Histology per Pathology Report: 02/12/2020  02/06/2020   Receptor Status: ER(90%), PR (40-50%), Her2-neu (Negative)  Did patient present with symptoms (if so, please note symptoms) or was this found on screening mammography?:  Screening mammogram 12/08/2019   Past/Anticipated interventions by surgeon, if any: 02/12/2020 Dr. Amie Critchley   02/06/2020 Dr. Amie Critchley   Past/Anticipated interventions by medical oncology, if any:  Currently under care of Dr. Jeanie Cooks ([facility] in [place], [place]) 02/19/2020   Lymphedema issues, if any:  Patient denies. Reports she had a follow-up with her surgeon on 02/17/20 where he drained a seroma from surgical incision    Pain issues, if any: Patient denies. Reports occasional discomfort at the surgical incisions, but states it's manageable  SAFETY ISSUES:  Prior radiation? No  Pacemaker/ICD? No  Possible current pregnancy? No--postmenopausal  Is the patient on methotrexate? No  Current Complaints / other details:   Patient has received both Pfizer vaccines, but has not received a COVID booster

## 2020-03-08 ENCOUNTER — Ambulatory Visit
Admission: RE | Admit: 2020-03-08 | Discharge: 2020-03-08 | Disposition: A | Discharge status: Home / Self Care | Payer: BC Managed Care – PPO | Source: Ambulatory Visit | Attending: Radiation Oncology | Admitting: Radiation Oncology

## 2020-03-08 ENCOUNTER — Encounter: Payer: Self-pay | Admitting: Radiation Oncology

## 2020-03-08 ENCOUNTER — Other Ambulatory Visit: Payer: Self-pay

## 2020-03-08 ENCOUNTER — Inpatient Hospital Stay: Payer: BC Managed Care – PPO | Attending: Radiation Oncology

## 2020-03-08 VITALS — BP 191/103 | HR 70 | Temp 97.0°F | Resp 18 | Ht 63.0 in | Wt 175.4 lb

## 2020-03-08 DIAGNOSIS — Z170 Estrogen receptor positive status [ER+]: Secondary | ICD-10-CM | POA: Diagnosis not present

## 2020-03-08 DIAGNOSIS — Z23 Encounter for immunization: Secondary | ICD-10-CM | POA: Diagnosis not present

## 2020-03-08 DIAGNOSIS — Z79899 Other long term (current) drug therapy: Secondary | ICD-10-CM | POA: Insufficient documentation

## 2020-03-08 DIAGNOSIS — C50812 Malignant neoplasm of overlapping sites of left female breast: Secondary | ICD-10-CM

## 2020-03-08 DIAGNOSIS — C50412 Malignant neoplasm of upper-outer quadrant of left female breast: Secondary | ICD-10-CM

## 2020-03-08 DIAGNOSIS — I10 Essential (primary) hypertension: Secondary | ICD-10-CM | POA: Insufficient documentation

## 2020-03-08 DIAGNOSIS — E039 Hypothyroidism, unspecified: Secondary | ICD-10-CM | POA: Insufficient documentation

## 2020-03-08 NOTE — Progress Notes (Signed)
Radiation Oncology         [phone] ________________________________  Initial outpatient Consultation  Name: Margaret Best MRN: [id number]  Date: 03/08/2020  DOB: [birth year]  YK:DXIPJA, Dionne Milo, MD  Jeanie Cooks, MD   REFERRING PHYSICIAN: Jeanie Cooks, MD  DIAGNOSIS:    ICD-10-CM   1. Malignant neoplasm of overlapping sites of left breast in female, estrogen receptor positive [facility])  C50.812 Ambulatory referral to Social Work   Z17.0   2. Carcinoma of upper-outer quadrant of left breast in female, estrogen receptor positive ([place])  C50.412    Z17.0    Cancer Staging Carcinoma of upper-outer quadrant of left breast in female, estrogen receptor positive ([place]) Staging form: Breast, AJCC 8th Edition - Pathologic stage from 03/08/2020: Stage IA (pT1b, pN0, cM0, G1, ER+, PR+, HER2-) - Signed by [name], MD on 03/08/2020 Stage prefix: Initial diagnosis Histologic grading system: 3 grade system   CHIEF COMPLAINT: Here to discuss management of left breast cancer  HISTORY OF PRESENT ILLNESS::Emalea Oyer is a 60 y.o. female who reports that her gynecologist appreciated a mass in her left breast on physical exam.  She underwent mammography which revealed a small mass at 12:00 of the left breast, spiculated mass with associated calcifications.  Ultrasound revealed a 1.2 cm mass in that same location, axilla negative.  Biopsy by ultrasound guidance of left breast revealed revealed grade 1 invasive ductal carcinoma with associated grade 3 DCIS.   Receptor status ER 90% PR 45 to 50%, HER-2 negative.  Ultimately she underwent lumpectomy and sentinel lymph node biopsy on 02/06/2020 in [place].  Final pathology revealed a T1bN0 tumor, invasive ductal carcinoma, grade 1, with associated grade 3 DCIS with necrosis, greatest extent of invasive disease 9 mm. All 5 sentinel lymph nodes were negative.   The margins were positive but upon reexcision on 02/12/2020, no residual  cancer was detected.    The patient wishes to receive her radiation in [place], where she lives,.  She had sought initial treatment in [place] due to some personal contacts that worked at a [place] center there. [facility] in [place], [place]) has served as her medical oncologist thus far and does not recommend chemotherapy but does recommend an aromatase inhibitor for 5 years.  Recommendations for her to start this after radiation is complete.  Tentatively she has plans to follow-up with him after radiation therapy but she is also thinking about transitioning care to a local oncologist closer to home.  Lymphedema issues, if any:  Patient denies. Reports she had a follow-up with her surgeon on 02/17/20 where he drained a seroma from surgical incision    Pain issues, if any: Patient denies. Reports occasional discomfort at the surgical incisions, but states it's manageable  SAFETY ISSUES:  Prior radiation? No  Pacemaker/ICD? No  Possible current pregnancy? No--postmenopausal  Is the patient on methotrexate? No  Current Complaints / other details:   Patient has received both Pfizer vaccines, but has not received a COVID booster   PREVIOUS RADIATION THERAPY: No  PAST MEDICAL HISTORY:  has a past medical history of Ectopic pregnancy, Hormone disorder, Hypertension, Missed ab, and Toxemia.    PAST SURGICAL HISTORY: Past Surgical History:  Procedure Laterality Date  . CESAREAN SECTION    . DILATION AND CURETTAGE OF UTERUS    . ENTEROLYSIS  1988   SBO  . SALPINGECTOMY  1987   left  . TUBAL LIGATION      FAMILY HISTORY:  family history includes Hypertension in her mother.  SOCIAL HISTORY:  reports that she has never smoked. She has never used smokeless tobacco. She reports current alcohol use. She reports that she does not use drugs.  ALLERGIES: Patient has no known allergies.  MEDICATIONS:  Current Outpatient Medications   Medication Sig Dispense Refill  . Cholecalciferol 50 MCG (2000 UT) CAPS Take 1 capsule by mouth daily.    Marland Kitchen levothyroxine (SYNTHROID, LEVOTHROID) 75 MCG tablet Take 75 mcg by mouth daily.    Marland Kitchen losartan-hydrochlorothiazide (HYZAAR) 100-12.5 MG tablet Take 1 tablet by mouth daily.    . metoprolol tartrate (LOPRESSOR) 100 MG tablet Take 100 mg by mouth 2 (two) times daily.     No current facility-administered medications for this encounter.    REVIEW OF SYSTEMS: as above   PHYSICAL EXAM:  height is '5\' 3"'  (1.6 m) and weight is 175 lb 6 oz (79.5 kg). Her temporal temperature is 97 F (36.1 C) (abnormal). Her blood pressure is 191/103 (abnormal) and her pulse is 70. Her respiration is 18 and oxygen saturation is 100%.   General: Alert and oriented, in no acute distress Psychiatric: Judgment and insight are intact. Affect is appropriate. Breasts: Satisfactory healing of left lumpectomy and left axillary scars.  Mild postoperative swelling of left breast..   ECOG = 0  0 - Asymptomatic (Fully active, able to carry on all predisease activities without restriction)  1 - Symptomatic but completely ambulatory (Restricted in physically strenuous activity but ambulatory and able to carry out work of a light or sedentary nature. For example, light housework, office work)  2 - Symptomatic, <50% in bed during the day (Ambulatory and capable of all self care but unable to carry out any work activities. Up and about more than 50% of waking hours)  3 - Symptomatic, >50% in bed, but not bedbound (Capable of only limited self-care, confined to bed or chair 50% or more of waking hours)  4 - Bedbound (Completely disabled. Cannot carry on any self-care. Totally confined to bed or chair)  5 - Death   Eustace Pen MM, Creech RH, Tormey DC, et al. [phone]). "Toxicity and response criteria of the [facility]". [place] Oncol. 5 (6): 649-55   LABORATORY DATA:  Lab Results  Component Value  Date   WBC 13.0 (H) 12/18/2017   HGB 13.1 12/18/2017   HCT 40.5 12/18/2017   MCV 95.7 12/18/2017   PLT 241 12/18/2017   CMP     Component Value Date/Time   NA 137 12/18/2017 1518   K 3.4 (L) 12/18/2017 1518   CL 103 12/18/2017 1518   CO2 26 12/18/2017 1518   GLUCOSE 95 12/18/2017 1518   BUN 17 12/18/2017 1518   CREATININE 0.78 12/18/2017 1518   CALCIUM 9.2 12/18/2017 1518   GFRNONAA >60 12/18/2017 1518   GFRAA >60 12/18/2017 1518        RADIOGRAPHY: As above  IMPRESSION/PLAN: This is a very nice 60 year old woman with ER positive stage I breast cancer, left breast  Her medical oncologist and surgeon are in [place].  She lives in [place] and wishes to get her radiation therapy closer to home.  She would eventually like to be transition to a medical oncologist here in [place].  She is going to research our specialists and let me know when she has chosen her preferred provider for a referral.  It was a pleasure meeting the patient today. We discussed the risks, benefits, and side effects  of radiotherapy. I recommend radiotherapy to the left breast.  To reduce her risk of locoregional recurrence by 2/3.  We discussed that radiation would take approximately 4 weeks to complete and that I would give the patient a few weeks to heal following surgery before starting treatment planning. We spoke about acute effects including skin irritation, breast pain, breast swelling or shrinkage, and fatigue as well as much less common late effects including internal organ injury or irritation.  The risk of lung injury, heart injury, serious injury to the ribs, nerves, or other normal tissues is unlikely. We spoke about the latest technology that is used to minimize the risk of late effects for patients undergoing radiotherapy to the breast or chest wall. No guarantees of treatment were given. The patient is enthusiastic about proceeding with treatment.  CT simulation will take place in the next  week and treatment about a week thereafter.  We discussed measures to reduce the risk of infection during the COVID-19 pandemic.  She is due for her booster shot.  I recommend that she receive this.  I let her know that we offer the [place] shot here at the cancer center.  She elected to proceed with this today.  She is hypertensive.  She does not have a primary care doctor.  She denies any symptoms related to the hypertension.  I recommended that she reach out to the [facility] group to schedule a new patient consultation.  Her daughter will help her facilitate this.  On date of service, in total, I spent 50 minutes on this encounter. Patient was seen in person.   __________________________________________   [name], MD

## 2020-03-08 NOTE — Progress Notes (Signed)
   Covid-19 Vaccination Clinic  Name:  Yailene Badia    MRN: [id number] DOB: 21-May-1960  03/08/2020  Ms. Hanko was observed post Covid-19 immunization for 15 minutes without incident. She was provided with Vaccine Information Sheet and instruction to access the V-Safe system.   Ms. Elling was instructed to call 911 with any severe reactions post vaccine: Marland Kitchen Difficulty breathing  . Swelling of face and throat  . A fast heartbeat  . A bad rash all over body  . Dizziness and weakness   Immunizations Administered    Name Date Dose VIS Date Route   PFIZER Comrnaty(Gray TOP) Covid-19 Vaccine 03/08/2020  9:40 AM 0.3 mL 12/11/2019 Intramuscular   Manufacturer: [employer]   Lot: [id number]   NDC: [phone]

## 2020-03-09 ENCOUNTER — Encounter: Payer: Self-pay | Admitting: Licensed Clinical Social Worker

## 2020-03-09 NOTE — Progress Notes (Signed)
[place] Psychosocial Distress Screening Clinical Social Work  Clinical Social Work was referred by distress screening protocol.  The patient scored a 5 on the Psychosocial Distress Thermometer which indicates moderate distress. Clinical Social Worker contacted patient by phone to assess for distress and other psychosocial needs.   Patient reports some stress with work as she has been working from home but Mickeal Skinner may be bringing people back to the office soon. However, she believes they will support her continuing from home while she completes treatment. Housing concern was that she is not able to clean as much as before due to healing from surgery. Teen son helps but could use a little extra support. CSW provided information on Cleaning for a Reason.  Otherwise, patient is just focused on finishing treatment. She has not processed the whole experience yet. CSW encouraged patient to utilize services available as she needs as she processes.  ONCBCN DISTRESS SCREENING 03/08/2020  Screening Type Initial Screening  Distress experienced in past week (1-10) 5  Practical problem type Housing;Work/school  Family Problem type Other (comment)  Emotional problem type Nervousness/Anxiety;Adjusting to illness;Feeling hopeless  Information Concerns Type Lack of info about treatment;Lack of info about complementary therapy choices  Referral to clinical psychology Yes  Referral to clinical social work Yes  Referral to dietition No  Referral to financial advocate No  Referral to support programs No  Referral to palliative care No    Clinical Social Worker follow up needed: No. Patient will reach out with any questions on support services programming (sent copy of monthly calendar and Cleaning for a Reason information)  If yes, follow up plan:  Margaret Best E Reeda Soohoo, LCSW

## 2020-03-11 ENCOUNTER — Telehealth: Payer: Self-pay

## 2020-03-11 NOTE — Telephone Encounter (Signed)
[facility] [place] RECORD AccessNurse Patient Name: Margaret Best Gender: Female DOB: 05/29/1990 Age: 60 Y 9 M 11 D Return Phone Number: [phone] (Primary) Address: City/State/Zip: [place] [address] Client [place] [facility] [place] [facility] [place] [facility] [place] Day Physician Garret Reddish- MD Contact Type Call Who Is Calling Patient / Member / Family / Caregiver Call Type Triage / Clinical Relationship To Patient Self Return Phone Number [phone] (Primary) Chief Complaint ABDOMINAL PAIN - Severe and only in abdomen Reason for Call Symptomatic / Request for [place] from the clinic, says pt does not speak good [place]. Pt has been having some abdominal pain and it has gotten worse today. Lyndee Leo advised to speak slow. Translation No Nurse Assessment Nurse: Rock Nephew, RN, Juliann Pulse Date/Time (Eastern Time): 03/11/2020 3:23:06 PM Confirm and document reason for call. If symptomatic, describe symptoms. ---Caller stated he is having severe abdominal pain. Does the patient have any new or worsening symptoms? ---Yes Will a triage be completed? ---Yes Related visit to physician within the last 2 weeks? ---Yes Does the PT have any chronic conditions? (i.e. diabetes, asthma, this includes High risk factors for pregnancy, etc.) ---No Is this a behavioral health or substance abuse call? ---No Guidelines Guideline Title Affirmed Question Affirmed Notes Nurse Date/Time Eilene Ghazi Time) Abdominal Pain - Upper [1] SEVERE pain (e.g., excruciating) AND [2] present > 1 hour Wells, RN, Juliann Pulse 03/11/2020 3:33:20 PM Disp. Time Eilene Ghazi Time) Disposition Final User 03/11/2020 3:19:30 PM Send to Urgent Darlyn Cl[place] 03/11/2020 3:36:31 PM Go to ED Now Yes Rock Nephew, RN, Gara Kroner Disagree/Comply Disagree PLEASE NOTE: All timestamps contained within this  report are represented as [place] Standard Time. CONFIDENTIALTY NOTICE: This fax transmission is intended only for the addressee. It contains information that is legally privileged, confidential or otherwise protected from use or disclosure. If you are not the intended recipient, you are strictly prohibited from reviewing, disclosing, copying using or disseminating any of this information or taking any action in reliance on or regarding this information. If you have received this fax in error, please notify us immediately by telephone so that we can arrange for its return to [place]. Phone: [phone], Toll-Free: [phone], Fax: [phone] Page: 2 of 2 Call Id: [id number] [place] Understands Yes PreDisposition Call Doctor Care Advice Given Per Guideline GO TO ED NOW: * You need to be seen in the Emergency Department. * Another adult should drive. NOTHING BY MOUTH: * Do not eat or drink anything for now. CARE ADVICE given per Abdominal Pain, Upper (Adult) guideline. * Leave now. Drive carefully. * Bring a list of your current medicines when you go to the Emergency Department (ER). Comments User: Valetta Mole, RN Date/Time Eilene Ghazi Time): 03/11/2020 3:37:46 PM Persian Interpreter # [id number] User: Valetta Mole, RN Date/Time Eilene Ghazi Time): 03/11/2020 3:40:51 PM after my previous documentation partient changed his mind and does plan to go to the ER Referrals Center

## 2020-03-12 ENCOUNTER — Telehealth: Payer: Self-pay | Admitting: Hematology

## 2020-03-12 ENCOUNTER — Other Ambulatory Visit: Payer: Self-pay

## 2020-03-12 ENCOUNTER — Ambulatory Visit
Admission: RE | Admit: 2020-03-12 | Discharge: 2020-03-12 | Disposition: A | Discharge status: Home / Self Care | Payer: BC Managed Care – PPO | Source: Ambulatory Visit | Attending: Radiation Oncology | Admitting: Radiation Oncology

## 2020-03-12 DIAGNOSIS — C50412 Malignant neoplasm of upper-outer quadrant of left female breast: Secondary | ICD-10-CM | POA: Diagnosis not present

## 2020-03-12 DIAGNOSIS — Z170 Estrogen receptor positive status [ER+]: Secondary | ICD-10-CM | POA: Diagnosis not present

## 2020-03-12 DIAGNOSIS — C50812 Malignant neoplasm of overlapping sites of left female breast: Secondary | ICD-10-CM | POA: Diagnosis not present

## 2020-03-12 DIAGNOSIS — Z510 Encounter for antineoplastic radiation therapy: Secondary | ICD-10-CM | POA: Insufficient documentation

## 2020-03-12 NOTE — Telephone Encounter (Signed)
Received a new patient referral from Dr. Isidore Moos for Margaret Best to see Dr. Burr Medico for breast cancer. Pt has been cld and scheduled to see Dr. Burr Medico on 3/24 at 3pm.

## 2020-03-19 ENCOUNTER — Encounter: Payer: Self-pay | Admitting: Family Medicine

## 2020-03-19 ENCOUNTER — Other Ambulatory Visit: Payer: Self-pay

## 2020-03-19 ENCOUNTER — Ambulatory Visit (INDEPENDENT_AMBULATORY_CARE_PROVIDER_SITE_OTHER): Payer: BC Managed Care – PPO | Admitting: Family Medicine

## 2020-03-19 VITALS — BP 181/88 | HR 73 | Temp 97.7°F | Ht 63.19 in | Wt 174.5 lb

## 2020-03-19 DIAGNOSIS — I10 Essential (primary) hypertension: Secondary | ICD-10-CM | POA: Diagnosis not present

## 2020-03-19 DIAGNOSIS — C50412 Malignant neoplasm of upper-outer quadrant of left female breast: Secondary | ICD-10-CM | POA: Diagnosis not present

## 2020-03-19 DIAGNOSIS — C50912 Malignant neoplasm of unspecified site of left female breast: Secondary | ICD-10-CM | POA: Diagnosis not present

## 2020-03-19 DIAGNOSIS — E039 Hypothyroidism, unspecified: Secondary | ICD-10-CM

## 2020-03-19 DIAGNOSIS — C50812 Malignant neoplasm of overlapping sites of left female breast: Secondary | ICD-10-CM | POA: Diagnosis not present

## 2020-03-19 DIAGNOSIS — Z510 Encounter for antineoplastic radiation therapy: Secondary | ICD-10-CM | POA: Diagnosis not present

## 2020-03-19 DIAGNOSIS — Z170 Estrogen receptor positive status [ER+]: Secondary | ICD-10-CM | POA: Diagnosis not present

## 2020-03-19 MED ORDER — HYDROCHLOROTHIAZIDE 25 MG PO TABS: 25.0000 mg | ORAL_TABLET | Freq: Every day | ORAL | 1 refills | Status: DC

## 2020-03-19 MED ORDER — AMLODIPINE BESYLATE 5 MG PO TABS: 5.0000 mg | ORAL_TABLET | Freq: Every day | ORAL | 1 refills | Status: DC

## 2020-03-19 NOTE — Patient Instructions (Signed)
Great to meet you today! Have labs completed downstairs.  Stop losartan/hctz Start hctz 25mg  and amlodipine 5mg . Continue metoprolol.   See me again in 1 month for blood pressure follow up.

## 2020-03-20 LAB — TSH: TSH: 1.54 mIU/L (ref 0.40–4.50)

## 2020-03-21 NOTE — Progress Notes (Signed)
Margaret Best - 60 y.o. female MRN [id number]  Date of birth: 06/03/1960  Subjective Chief Complaint  Patient presents with  . Establish Care    HPI Margaret Best is a 60 y.o. female here today for initial visit to establish care.  She has a history of HTN, hypothyroidism, and recent diagnosis of breast cancer.  She had recent lumpectomy and reports that she will start radiation next week.  She is handling this pretty well so far.   HTN is currently managed with combination of losartan/hctz and metoprolol.  She has had cough since starting losartan and thinks this may be related.  She has had some occasional headache, not sure if this is related to her BP.  She denies chest pain, shortness of breath, palpitations, or vision changes.   She feels pretty good with current dose of levothyroxine.  She is due for updated TSH.   ROS:  A comprehensive ROS was completed and negative except as noted per HPI  No Known Allergies  Past Medical History:  Diagnosis Date  . Ectopic pregnancy    left  . Hormone disorder    HYPOTHYROIDISM  . Hypertension   . Missed ab    FIRST TRIMESTER  . Toxemia     Past Surgical History:  Procedure Laterality Date  . BREAST LUMPECTOMY Left   . CESAREAN SECTION    . DILATION AND CURETTAGE OF UTERUS    . ENTEROLYSIS  1988   SBO  . SALPINGECTOMY  1987   left  . TUBAL LIGATION      Social History   Socioeconomic History  . Marital status: Divorced    Spouse name: Not on file  . Number of children: 2  . Years of education: Not on file  . Highest education level: Not on file  Occupational History  . Not on file  Tobacco Use  . Smoking status: Never Smoker  . Smokeless tobacco: Never Used  Vaping Use  . Vaping Use: Never used  Substance and Sexual Activity  . Alcohol use: Yes    Alcohol/week: 0.0 - 1.0 standard drinks    Comment: OCC  . Drug use: No  . Sexual activity: Not Currently    Comment: 1st intercourse 60 yo-More than 5  partners  Other Topics Concern  . Not on file  Social History Narrative  . Not on file   Social Determinants of Health   Financial Resource Strain: Not on file  Food Insecurity: Not on file  Transportation Needs: Not on file  Physical Activity: Not on file  Stress: Not on file  Social Connections: Not on file    Family History  Problem Relation Age of Onset  . Hypertension Mother   . Stroke Mother   . Heart failure Mother   . Diabetes Father     Health Maintenance  Topic Date Due  . Hepatitis C Screening  Never done  . HIV Screening  Never done  . COLONOSCOPY (Pts 45-60yrs Insurance coverage will need to be confirmed)  Never done  . PAP SMEAR-Modifier  07/08/2018  . INFLUENZA VACCINE  09/02/2020 (Originally 08/03/2019)  . COVID-19 Vaccine (4 - Booster for Pfizer series) 09/08/2020  . MAMMOGRAM  01/27/2022  . TETANUS/TDAP  01/06/2025  . HPV VACCINES  Aged Out     ----------------------------------------------------------------------------------------------------------------------------------------------------------------------------------------------------------------- Physical Exam BP (!) 181/88 (BP Location: Left Arm, Patient Position: Sitting, Cuff Size: Normal)   Pulse 73   Temp 97.7 F (36.5 C)   Ht 5' 3.19" (1.605  m)   Wt 174 lb 8 oz (79.2 kg)   LMP 01/20/2012   SpO2 100%   BMI 30.73 kg/m   Physical Exam Constitutional:      Appearance: Normal appearance.  HENT:     Right Ear: Tympanic membrane normal.     Left Ear: Tympanic membrane normal.  Eyes:     General: No scleral icterus. Cardiovascular:     Rate and Rhythm: Normal rate and regular rhythm.  Pulmonary:     Effort: Pulmonary effort is normal.     Breath sounds: Normal breath sounds.  Musculoskeletal:     Cervical back: Neck supple.  Neurological:     General: No focal deficit present.     Mental Status: She is alert.  Psychiatric:        Mood and Affect: Mood normal.        Behavior:  Behavior normal.     ------------------------------------------------------------------------------------------------------------------------------------------------------------------------------------------------------------------- Assessment and Plan  HTN (hypertension) BP elevated today.  She would like to discontinue losartan due to concern about side effect.  Will start amlodipine to replace this and continue HCTZ at 25mg .  She will also continue metoprolol at current strength.  Return in about 4 weeks (around 04/16/2020) for BP f/u.   Infiltrating ductal carcinoma of left breast (HCC) S/p lumpectomy.  She will start radiation next week.   Hypothyroidism Update TSH    Meds ordered this encounter  Medications  . amLODipine (NORVASC) 5 MG tablet    Sig: Take 1 tablet (5 mg total) by mouth daily.    Dispense:  90 tablet    Refill:  1  . hydrochlorothiazide (HYDRODIURIL) 25 MG tablet    Sig: Take 1 tablet (25 mg total) by mouth daily.    Dispense:  90 tablet    Refill:  1    Return in about 4 weeks (around 04/16/2020) for BP f/u.    This visit occurred during the SARS-CoV-2 public health emergency.  Safety protocols were in place, including screening questions prior to the visit, additional usage of staff PPE, and extensive cleaning of exam room while observing appropriate contact time as indicated for disinfecting solutions.

## 2020-03-21 NOTE — Assessment & Plan Note (Signed)
Update TSH

## 2020-03-21 NOTE — Assessment & Plan Note (Signed)
S/p lumpectomy.  She will start radiation next week.

## 2020-03-21 NOTE — Assessment & Plan Note (Signed)
BP elevated today.  She would like to discontinue losartan due to concern about side effect.  Will start amlodipine to replace this and continue HCTZ at 25mg .  She will also continue metoprolol at current strength.  Return in about 4 weeks (around 04/16/2020) for BP f/u.

## 2020-03-22 NOTE — Progress Notes (Signed)
[facility]   [phone] [phone]   Clinic New Consult Note   Patient Care Team: Matthews, Cody, DO as PCP - General (Family Medicine)  Date of Service:  03/25/2020   CHIEF COMPLAINTS/PURPOSE OF CONSULTATION:  Left breast cancer   REFERRING PHYSICIAN: Dr. Squire    Oncology History Overview Note  Cancer Staging Carcinoma of upper-outer quadrant of left breast in female, estrogen receptor positive (HCC) Staging form: Breast, AJCC 8th Edition - Pathologic stage from 03/08/2020: Stage IA (pT1b, pN0, cM0, G1, ER+, PR+, HER2-) - Signed by Squire, Sarah, MD on 03/08/2020 Stage prefix: Initial diagnosis Histologic grading system: 3 grade system    Carcinoma of upper-outer quadrant of left breast in female, estrogen receptor positive (HCC)  12/08/2019 Breast US   Left breast US 12/08/19 Findings:  At the left 12:00 position 7cmfn there is a small mass 1.2x1x0.7cm with spiculated margins and associated calcifications.    01/25/2020 Breast MRI   MRI Breast 01/25/20 Impression Left bresat with lobular enhanceing mass 11:00 position, 6.3cm from nipple, measuring 0.8x1.4x1cm. This is suspicious.  There is linear ductal enhanement extending posteriorly 1.1cm and 2 areas of linear non-mass enhancent extending exteriorly measuring 0.8 and 3.1cm in length. There may represent insitue cancer.    01/28/2020 Initial Biopsy   OUTSIDE SCAN   02/06/2020 Surgery   Left Lumpectomy by Dr Barr-poole     02/12/2020 Surgery   Left breast re-excision surgery by Dr Barr-Poole     03/08/2020 Initial Diagnosis   Carcinoma of upper-outer quadrant of left breast in female, estrogen receptor positive (HCC)   03/08/2020 Cancer Staging   Staging form: Breast, AJCC 8th Edition - Pathologic stage from 03/08/2020: Stage IA (pT1b, pN0, cM0, G1, ER+, PR+, HER2-) - Signed by Squire, Sarah, MD on 03/08/2020 Stage prefix: Initial diagnosis Histologic grading system: 3 grade system       HISTORY OF PRESENTING ILLNESS:  Margaret Best 60 y.o. female is a here because of left breast cancer. The patient presents to the clinic today alone.   Patient reports that her gynecologist found a palpable mass in the left breast on exam in November 2021.  Her cousin is an oncologist in [place], so she went there for her care. She underwent mammogram which revealed a small mass at 12 o'clock position of the left breast, with associated calcifications.  Ultrasound revealed a 1.2 cm mass in the same location, no other new lesions, x-ray was negative.  She underwent left breast mass biopsy which showed grade 1 invasive ductal carcinoma with associated high-grade DCIS, ER 90% positive, PR 45 to 50% positive, HER-2 negative.  She underwent lumpectomy and sentinel lymph node biopsy on February 06, 2020 by Dr. Baum in [place]. surgical pathology showed invasive ductal carcinoma, grade 1, 0.9 cm, with associated grade 3 DCIS with necrosis.  Margins were negative but the DCIS margins were close. 5 nodes were all negative. Patient developed hematoma after surgery, and had a second surgery for hematoma evacuation and wider margin, and it revealed no malignant cells. She saw medical oncologist Dr. Carvajal in [place] and adjuvant radiation and antiestrogen therapy were recommended, Oncotype was not recommended.   She has been recovering well from surgery, pain is minimal, her shoulder mobility is normal.  No other new complaints.  She has met our radiation oncologist Dr. Squire, and plan to start radiation today.   Socially she is divorced, lives alone.  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 She has two children.     She has a PMHx of hypertension and thyroid disease, she sees primary care physician Dr. Howie Ill.    GYN HISTORY  Menarchal: 14 LMP: 2017 Contraceptive: none  HRT: none  G3P2, she was 13 with first pregnancy     REVIEW OF SYSTEMS:    Constitutional: Denies fevers, chills or abnormal night  sweats Eyes: Denies blurriness of vision, double vision or watery eyes Ears, nose, mouth, throat, and face: Denies mucositis or sore throat Respiratory: Denies cough, dyspnea or wheezes Cardiovascular: Denies palpitation, chest discomfort or lower extremity swelling Gastrointestinal:  Denies nausea, heartburn or change in bowel habits Skin: Denies abnormal skin rashes Lymphatics: Denies new lymphadenopathy or easy bruising Neurological:Denies numbness, tingling or new weaknesses Behavioral/Psych: Mood is stable, no new changes  All other systems were reviewed with the patient and are negative.   MEDICAL HISTORY:  Past Medical History:  Diagnosis Date  . Ectopic pregnancy    left  . Hormone disorder    HYPOTHYROIDISM  . Hypertension   . Missed ab    FIRST TRIMESTER  . Toxemia     SURGICAL HISTORY: Past Surgical History:  Procedure Laterality Date  . BREAST LUMPECTOMY Left   . CESAREAN SECTION    . DILATION AND CURETTAGE OF UTERUS    . ENTEROLYSIS  1988   SBO  . SALPINGECTOMY  1987   left  . TUBAL LIGATION      SOCIAL HISTORY: Social History   Socioeconomic History  . Marital status: Divorced    Spouse name: Not on file  . Number of children: 2  . Years of education: Not on file  . Highest education level: Not on file  Occupational History  . Not on file  Tobacco Use  . Smoking status: Never Smoker  . Smokeless tobacco: Never Used  Vaping Use  . Vaping Use: Never used  Substance and Sexual Activity  . Alcohol use: Yes    Alcohol/week: 0.0 - 1.0 standard drinks    Comment: OCC  . Drug use: No  . Sexual activity: Not Currently    Comment: 1st intercourse 60 yo-More than 5 partners  Other Topics Concern  . Not on file  Social History Narrative  . Not on file   Social Determinants of Health   Financial Resource Strain: Not on file  Food Insecurity: Not on file  Transportation Needs: Not on file  Physical Activity: Not on file  Stress: Not on file   Social Connections: Not on file  Intimate Partner Violence: Not on file    FAMILY HISTORY: Family History  Problem Relation Age of Onset  . Hypertension Mother   . Stroke Mother   . Heart failure Mother   . Diabetes Father     ALLERGIES:  has No Known Allergies.  MEDICATIONS:  Current Outpatient Medications  Medication Sig Dispense Refill  . amLODipine (NORVASC) 5 MG tablet Take 1 tablet (5 mg total) by mouth daily. 90 tablet 1  . hydrochlorothiazide (HYDRODIURIL) 25 MG tablet Take 1 tablet (25 mg total) by mouth daily. 90 tablet 1  . levothyroxine (SYNTHROID, LEVOTHROID) 75 MCG tablet Take 75 mcg by mouth daily.    . metoprolol tartrate (LOPRESSOR) 100 MG tablet Take 100 mg by mouth 2 (two) times daily.     No current facility-administered medications for this visit.    PHYSICAL EXAMINATION: ECOG PERFORMANCE STATUS: 0 - Asymptomatic  Vitals:   03/25/20 1519  BP: (!) 167/99  Pulse: 68  Resp: 16  Temp: 97.6 F (  36.4 C)  SpO2: 99%   Filed Weights   03/25/20 1519  Weight: 179 lb 1.6 oz (81.2 kg)    GENERAL:alert, no distress and comfortable SKIN: skin color, texture, turgor are normal, no rashes or significant lesions EYES: normal, Conjunctiva are pink and non-injected, sclera clear NECK: supple, thyroid normal size, non-tender, without nodularity LYMPH:  no palpable lymphadenopathy in the cervical, axillary  LUNGS: clear to auscultation and percussion with normal breathing effort HEART: regular rate & rhythm and no murmurs and no lower extremity edema ABDOMEN:abdomen soft, non-tender and normal bowel sounds Musculoskeletal:no cyanosis of digits and no clubbing  NEURO: alert & oriented x 3 with fluent speech, no focal motor/sensory deficits BREAST: Status post left lumpectomy, incision has healed well, with soft tissue hardness around the incision, no palpable mass in both breasts, nodules or adenopathy bilaterally.   LABORATORY DATA:  I have reviewed the data  as listed CBC Latest Ref Rng & Units 12/18/2017 01/25/2012  WBC 4.0 - 10.5 K/uL 13.0(H) 7.2  Hemoglobin 12.0 - 15.0 g/dL 13.1 15.3(H)  Hematocrit 36.0 - 46.0 % 40.5 44.6  Platelets 150 - 400 K/uL 241 298    CMP Latest Ref Rng & Units 12/18/2017  Glucose 70 - 99 mg/dL 95  BUN 6 - 20 mg/dL 17  Creatinine 0.44 - 1.00 mg/dL 0.78  Sodium 135 - 145 mmol/L 137  Potassium 3.5 - 5.1 mmol/L 3.4(L)  Chloride 98 - 111 mmol/L 103  CO2 22 - 32 mmol/L 26  Calcium 8.9 - 10.3 mg/dL 9.2     RADIOGRAPHIC STUDIES: I have personally reviewed the radiological images as listed and agreed with the findings in the report. No results found.  ASSESSMENT & PLAN:  Margaret Best is a 60 y.o. female with a history of H/o ectopic pregnancy, Hypothyroidism, HTN, presented with a small palpable left breast mass.   1. Left breast cancer, Stage IA, p(T1bN0M0), ER+/PR+/HER2-, Grade I -She was diagnosed in 01/2020 -She underwent left lumpectomy by Dr Baum in [place] on 02/06/20 and re-excision on 02/12/20 for close margin and hematoma evacuation. -I reviewed her surgical pathology with patient in details.  She has a 0.9 mm invasive ductal carcinoma, grade 1, post ER and PR positive, HER-2 negative, with negative margins and negative lymph nodes. -I discussed her risk of recurrence is probably low, given the low-grade disease and small size of her tumor. I think it is reasonable to skip Oncotype test, due to the likely low risk of disease  -I agree with adjuvant radiation to reduce her risk of local recurrence, she is starting with Dr. Squire today. --Given the strong ER and PR positivity, I do recommend adjuvant aromatase inhibitor to reduce her risk of cancer recurrence,  The potential benefit and side effects, which includes but not limited to, hot flash, skin and vaginal dryness, metabolic changes ( increased blood glucose, cholesterol, weight, etc.), slightly in increased risk of cardiovascular disease, cataracts,  muscular and joint discomfort, osteopenia and osteoporosis, etc, were discussed with her in great details. She is interested, and we'll start after she completes radiation. -I recommend anastrozole for 5-7 years. I also discussed the alternative option of tamoxifen if she has poor tolerance to AI. -We discussed breast cancer surveillance, will continue annual mammogram.  I also encouraged her to consider screening breast MRI due to her high-grade DCIS which predicts high risk of future breast cancer. -I plan to see her back at the end of radiation, to start anastrozole.  2. HTN and   hypothyroidism -Continue medication, follow-up with PCP.  3. Bone health  -I recommend a baseline bone density scan in the next few months, order placed.  -We discussed the negative impact on her bone density from aromatase inhibitor. -I encouraged her to start calcium and vitamin D supplement.  PLAN:  -we reviewed surgical path and adjuvant therapy -Plan to see her back the last week of her radiation, to start adjuvant anastrozole -Patient is claustrophobic, per her request, I prescribed 15 tablets of low-dose lorazepam for her to use as needed before radiation.  We discussed benzo dependence, and she will only use as needed.    No orders of the defined types were placed in this encounter.   All questions were answered. The patient knows to call the clinic with any problems, questions or concerns. The total time spent in the appointment was 50 minutes.     [name], MD 03/25/2020 3:22 PM  I, Joslyn Devon, am acting as scribe for [name], MD.   I have reviewed the above documentation for accuracy and completeness, and I agree with the above.

## 2020-03-23 ENCOUNTER — Ambulatory Visit: Payer: BC Managed Care – PPO | Admitting: Radiation Oncology

## 2020-03-24 ENCOUNTER — Ambulatory Visit: Payer: BC Managed Care – PPO | Admitting: Radiation Oncology

## 2020-03-25 ENCOUNTER — Other Ambulatory Visit: Payer: Self-pay

## 2020-03-25 ENCOUNTER — Ambulatory Visit
Admission: RE | Admit: 2020-03-25 | Discharge: 2020-03-25 | Disposition: A | Discharge status: Home / Self Care | Payer: BC Managed Care – PPO | Source: Ambulatory Visit | Attending: Radiation Oncology | Admitting: Radiation Oncology

## 2020-03-25 ENCOUNTER — Inpatient Hospital Stay (HOSPITAL_BASED_OUTPATIENT_CLINIC_OR_DEPARTMENT_OTHER): Payer: BC Managed Care – PPO | Admitting: Hematology

## 2020-03-25 ENCOUNTER — Encounter: Payer: Self-pay | Admitting: Hematology

## 2020-03-25 VITALS — BP 167/99 | HR 68 | Temp 97.6°F | Resp 16 | Ht 63.19 in | Wt 179.1 lb

## 2020-03-25 DIAGNOSIS — Z170 Estrogen receptor positive status [ER+]: Secondary | ICD-10-CM | POA: Diagnosis not present

## 2020-03-25 DIAGNOSIS — E2839 Other primary ovarian failure: Secondary | ICD-10-CM

## 2020-03-25 DIAGNOSIS — I10 Essential (primary) hypertension: Secondary | ICD-10-CM

## 2020-03-25 DIAGNOSIS — C50812 Malignant neoplasm of overlapping sites of left female breast: Secondary | ICD-10-CM | POA: Diagnosis not present

## 2020-03-25 DIAGNOSIS — Z510 Encounter for antineoplastic radiation therapy: Secondary | ICD-10-CM | POA: Diagnosis not present

## 2020-03-25 DIAGNOSIS — E039 Hypothyroidism, unspecified: Secondary | ICD-10-CM | POA: Diagnosis not present

## 2020-03-25 DIAGNOSIS — C50412 Malignant neoplasm of upper-outer quadrant of left female breast: Secondary | ICD-10-CM

## 2020-03-25 MED ORDER — LORAZEPAM 0.5 MG PO TABS: 0.5000 mg | ORAL_TABLET | Freq: Every day | ORAL | 0 refills | Status: DC | PRN

## 2020-03-26 ENCOUNTER — Telehealth: Payer: Self-pay | Admitting: Hematology

## 2020-03-26 ENCOUNTER — Ambulatory Visit
Admission: RE | Admit: 2020-03-26 | Discharge: 2020-03-26 | Disposition: A | Discharge status: Home / Self Care | Payer: BC Managed Care – PPO | Source: Ambulatory Visit | Attending: Radiation Oncology | Admitting: Radiation Oncology

## 2020-03-26 DIAGNOSIS — Z510 Encounter for antineoplastic radiation therapy: Secondary | ICD-10-CM | POA: Diagnosis not present

## 2020-03-26 DIAGNOSIS — C50412 Malignant neoplasm of upper-outer quadrant of left female breast: Secondary | ICD-10-CM | POA: Diagnosis not present

## 2020-03-26 DIAGNOSIS — Z170 Estrogen receptor positive status [ER+]: Secondary | ICD-10-CM | POA: Diagnosis not present

## 2020-03-26 DIAGNOSIS — C50812 Malignant neoplasm of overlapping sites of left female breast: Secondary | ICD-10-CM | POA: Diagnosis not present

## 2020-03-26 NOTE — Telephone Encounter (Signed)
Checked out appointment. No LOS notes needing to be scheduled. No changes made. 

## 2020-03-28 ENCOUNTER — Encounter: Payer: Self-pay | Admitting: Hematology

## 2020-03-29 ENCOUNTER — Other Ambulatory Visit: Payer: Self-pay

## 2020-03-29 ENCOUNTER — Telehealth: Payer: Self-pay | Admitting: Hematology

## 2020-03-29 ENCOUNTER — Ambulatory Visit
Admission: RE | Admit: 2020-03-29 | Discharge: 2020-03-29 | Disposition: A | Discharge status: Home / Self Care | Payer: BC Managed Care – PPO | Source: Ambulatory Visit | Attending: Radiation Oncology | Admitting: Radiation Oncology

## 2020-03-29 DIAGNOSIS — C50412 Malignant neoplasm of upper-outer quadrant of left female breast: Secondary | ICD-10-CM

## 2020-03-29 DIAGNOSIS — Z170 Estrogen receptor positive status [ER+]: Secondary | ICD-10-CM

## 2020-03-29 DIAGNOSIS — Z510 Encounter for antineoplastic radiation therapy: Secondary | ICD-10-CM | POA: Diagnosis not present

## 2020-03-29 DIAGNOSIS — C50812 Malignant neoplasm of overlapping sites of left female breast: Secondary | ICD-10-CM | POA: Diagnosis not present

## 2020-03-29 MED ORDER — RADIAPLEXRX EX GEL: Freq: Once | CUTANEOUS | Status: AC

## 2020-03-29 MED ORDER — ALRA NON-METALLIC DEODORANT (RAD-ONC)
1.0000 "application " | Freq: Once | TOPICAL | Status: AC
  Administered 2020-03-29: 1 via TOPICAL

## 2020-03-29 NOTE — Telephone Encounter (Signed)
Scheduled appt per 3/27 sch msg.  Called pt, no answer. Left msg with appts date and times.

## 2020-03-29 NOTE — Progress Notes (Signed)

## 2020-03-30 ENCOUNTER — Ambulatory Visit
Admission: RE | Admit: 2020-03-30 | Discharge: 2020-03-30 | Disposition: A | Discharge status: Home / Self Care | Payer: BC Managed Care – PPO | Source: Ambulatory Visit | Attending: Radiation Oncology | Admitting: Radiation Oncology

## 2020-03-30 ENCOUNTER — Encounter: Payer: Self-pay | Admitting: Physical Therapy

## 2020-03-30 ENCOUNTER — Ambulatory Visit: Payer: BC Managed Care – PPO | Attending: Hematology | Admitting: Physical Therapy

## 2020-03-30 DIAGNOSIS — M25612 Stiffness of left shoulder, not elsewhere classified: Secondary | ICD-10-CM | POA: Insufficient documentation

## 2020-03-30 DIAGNOSIS — M6281 Muscle weakness (generalized): Secondary | ICD-10-CM | POA: Insufficient documentation

## 2020-03-30 DIAGNOSIS — Z170 Estrogen receptor positive status [ER+]: Secondary | ICD-10-CM | POA: Diagnosis not present

## 2020-03-30 DIAGNOSIS — C50812 Malignant neoplasm of overlapping sites of left female breast: Secondary | ICD-10-CM | POA: Diagnosis not present

## 2020-03-30 DIAGNOSIS — Z483 Aftercare following surgery for neoplasm: Secondary | ICD-10-CM | POA: Insufficient documentation

## 2020-03-30 DIAGNOSIS — C50412 Malignant neoplasm of upper-outer quadrant of left female breast: Secondary | ICD-10-CM | POA: Diagnosis not present

## 2020-03-30 DIAGNOSIS — Z510 Encounter for antineoplastic radiation therapy: Secondary | ICD-10-CM | POA: Diagnosis not present

## 2020-03-30 NOTE — Patient Instructions (Signed)

## 2020-03-30 NOTE — Therapy (Signed)
[place], [place], [address] Phone: [phone]   Fax:  [phone]  Physical Therapy Evaluation  Patient Details  Name: Margaret Best MRN: [id number] Date of Birth: 11-Apr-1960 Referring Provider (PT): Dr. Burr Medico   Encounter Date: 03/30/2020   PT End of Session - 03/30/20 1655    Visit Number 1    Number of Visits 9    Date for PT Re-Evaluation 06/03/20    PT Start Time 1600    PT Stop Time 1645    PT Time Calculation (min) 45 min    Activity Tolerance Patient tolerated treatment well    Behavior During Therapy [facility] for tasks assessed/performed           Past Medical History:  Diagnosis Date  . Ectopic pregnancy    left  . Hormone disorder    HYPOTHYROIDISM  . Hypertension   . Missed ab    FIRST TRIMESTER  . Thyroid disease   . Toxemia     Past Surgical History:  Procedure Laterality Date  . BREAST LUMPECTOMY Left   . CESAREAN SECTION    . DILATION AND CURETTAGE OF UTERUS    . ENTEROLYSIS  1988   SBO  . SALPINGECTOMY  1987   left  . TUBAL LIGATION      There were no vitals filed for this visit.    Subjective Assessment - 03/30/20 1609    Subjective Pt is here becuase she wants to learn what to do get stronger and avoid lymphedema    Pertinent History grade 1 invasive ductal carcinoma with associated high-grade DCIS, ER 90% positive, PR 45 to 50% positive, HER-2 negative.  She underwent lumpectomy and sentinel lymph node biopsy 0/5 on February 06, 2020 by Dr. Johny Blamer in [place]. She develped a hematoma so had to have a second surgery to have that removed. She did not have chemotherapy and is currenrly  having radiation . past history includes high blood pressure and hypothyroidism  several years ago she had limitation of left shoulder that mostly resolved, but she has some tightness in limits of range of motion    Patient Stated Goals to learn exercise to prevent lymphedema    Currently in  Pain? No/denies              [facility] PT Assessment - 03/30/20 0001      Assessment   Medical Diagnosis left breast cancer    Referring Provider (PT) Dr. Burr Medico    Onset Date/Surgical Date 03/05/20   second sugery was  Feb. 10   Hand Dominance Right      Precautions   Precautions Other (comment)    Precaution Comments at risk for lymphedema      Restrictions   Weight Bearing Restrictions No      Balance Screen   Has the patient fallen in the past 6 months No    Has the patient had a decrease in activity level because of a fear of falling?  No    Is the patient reluctant to leave their home because of a fear of falling?  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    Leisure does not exercise on a regulare basis, but wants to      Cognition   Overall Cognitive Status Within Functional Limits for tasks assessed      Observation/Other Assessments   Observations well healed incision on left breast and in axilla    Skin Integrity no open areas    Other Surveys  Quick Dash    Quick DASH  27.27      Sensation   Additional Comments no problem      Coordination   Gross Motor Movements are Fluid and Coordinated Yes      Posture/Postural Control   Posture/Postural Control Postural limitations    Postural Limitations Rounded Shoulders;Forward head    Posture Comments small scoliosic curve  at lumbar spine      ROM / Strength   AROM / PROM / Strength AROM;Strength      AROM   Overall AROM  Deficits    AROM Assessment Site Shoulder    Right/Left Shoulder Right;Left    Right Shoulder Flexion 175 Degrees    Right Shoulder ABduction 178 Degrees    Right Shoulder External Rotation 93  Degrees    Left Shoulder Flexion 147 Degrees    Left Shoulder ABduction 160 Degrees    Left Shoulder External Rotation 85 Degrees      Strength   Overall Strength Deficits    Overall Strength Comments appears to have genralized decondidioned with diffuse muscle atrophy             LYMPHEDEMA/ONCOLOGY QUESTIONNAIRE - 03/30/20 0001      Type   Cancer Type left breast cancer      Surgeries   Lumpectomy Date 02/06/20    Other Surgery Date 03/11/20    Number Lymph Nodes Removed 5      Treatment   Active Chemotherapy Treatment No    Past Chemotherapy Treatment No    Active Radiation Treatment Yes    Current Hormone Treatment No    Past Hormone Therapy No      What other symptoms do you have   Are you Having Heaviness or Tightness No    Are you having Pain Yes   achiness in shoulder after movement   Are you having pitting edema No    Is it Hard or Difficult finding clothes that fit No    Do you have infections No    Is there Decreased scar mobility No    [place] Sign No      Lymphedema Assessments   Lymphedema Assessments Upper extremities      Right Upper Extremity Lymphedema   10 cm Proximal to Olecranon Process 28.5 cm    Olecranon Process 24 cm    15 cm Proximal to Ulnar Styloid Process 24.2 cm    Just Proximal to Ulnar Styloid Process 15 cm    Across Hand at [employer] 18 cm    At [place] of 2nd Digit 6 cm      Left Upper Extremity Lymphedema   10 cm Proximal to Olecranon Process 28.5 cm    Olecranon Process 24 cm    15 cm Proximal to Ulnar Styloid Process 24.5 cm    Just Proximal to Ulnar Styloid Process 14.8 cm    Across Hand at [employer] 17.5 cm    At [place] of 2nd Digit 6 cm  [name] - 03/30/20 0001    Open a tight or new jar No difficulty    Do heavy household chores (wash walls, wash floors) Moderate difficulty    Carry a shopping bag or briefcase Severe difficulty    Wash your back Mild difficulty    Use a knife to  cut food No difficulty    Recreational activities in which you take some force or impact through your arm, shoulder, or hand (golf, hammering, tennis) Severe difficulty    During the past week, to what extent has your arm, shoulder or hand problem interfered with your normal social activities with family, friends, neighbors, or groups? Slightly    During the past week, to what extent has your arm, shoulder or hand problem limited your work or other regular daily activities Not at all    Arm, shoulder, or hand pain. None    Tingling (pins and needles) in your arm, shoulder, or hand Mild    Difficulty Sleeping Mild difficulty    DASH Score 27.27 %            Objective measurements completed on examination: See above findings.                    PT Long Term Goals - 03/30/20 1648      PT LONG TERM GOAL #1   Title Pt will be able to verbalize lymphedem risk reduction practices    Time 8    Period Weeks    Status New      PT LONG TERM GOAL #2   Title Pt will be independent in a home exercise program for UE stregthening to decrease lymphedema risk    Time 8    Period Weeks    Status New      PT LONG TERM GOAL #3   Title Pt will have pain free shoulder flexion of 160 degrees so that she can do overhead activities easier    Baseline 147 degrees  on eval  03/30/2020    Status New                  Plan - 03/30/20 1655    Clinical Impression Statement Pt comes to PT after left lumpectomy wit 5 nodes removed on 02/06/2020  with re-excision for evacutation of hematoma on 02/12/2020 currently under radiation.  She has past history of left shoulder limitation and wants to learn how to get stronger to prevent lymphedema.  She was instructed in stretches today and will come back after radiation is complete to learn Strenth ABC program and how to progress resistance exercises pt given information about ABC class for education about lymphedema risk reduction    Personal  Factors and Comorbidities Comorbidity 3+    Comorbidities previous left shoulder limitation, lumpectomy x 2, current radiation    Examination-Activity Limitations Reach Overhead;Carry    Examination-Participation Restrictions Cleaning;Laundry    Stability/Clinical Decision Making Evolving/Moderate complexity    Clinical Decision Making Moderate    Rehab Potential Excellent    PT Frequency 2x / week    PT Duration 4 weeks   start after radiation is complete   PT Treatment/Interventions ADLs/Self Care Home Management;Therapeutic activities;Therapeutic exercise;Neuromuscular re-education;Patient/family education;Manual techniques;Manual lymph drainage;Compression bandaging;Passive range of motion;Joint Manipulations;Taping;Orthotic Fit/Training    PT Next Visit Plan reassess after radiatoin, make sure she has attended ABC class, teach Strength ABC program and progressive resistive exericses    Consulted and Agree with Plan of Care Patient  Patient will benefit from skilled therapeutic intervention in order to improve the following deficits and impairments:  Decreased skin integrity,Impaired perceived functional ability,Decreased range of motion,Decreased activity tolerance,Decreased strength,Increased fascial restricitons,Impaired UE functional use,Postural dysfunction,Pain  Visit Diagnosis: Aftercare following surgery for neoplasm  Stiffness of left shoulder, not elsewhere classified  Muscle weakness (generalized)     Problem List Patient Active Problem List   Diagnosis Date Noted  . Carcinoma of upper-outer quadrant of left breast in female, estrogen receptor positive ([place]) 03/08/2020  . Fluctuating blood pressure 02/28/2018  . Libido, decreased 07/08/2015  . PMB (postmenopausal bleeding) 07/08/2015  . HTN (hypertension) 01/25/2012  . Hypothyroidism 01/25/2012   Donato Heinz. Owens Shark PT  Norwood Levo 03/30/2020, 5:03 PM  [place], [place], [address] Phone: [phone]   Fax:  [phone]  Name: Margaret Best MRN: [id number] Date of Birth: 1960/12/31

## 2020-03-31 ENCOUNTER — Ambulatory Visit
Admission: RE | Admit: 2020-03-31 | Discharge: 2020-03-31 | Disposition: A | Discharge status: Home / Self Care | Payer: BC Managed Care – PPO | Source: Ambulatory Visit | Attending: Radiation Oncology | Admitting: Radiation Oncology

## 2020-03-31 ENCOUNTER — Other Ambulatory Visit: Payer: Self-pay

## 2020-03-31 DIAGNOSIS — Z510 Encounter for antineoplastic radiation therapy: Secondary | ICD-10-CM | POA: Diagnosis not present

## 2020-03-31 DIAGNOSIS — Z170 Estrogen receptor positive status [ER+]: Secondary | ICD-10-CM | POA: Diagnosis not present

## 2020-03-31 DIAGNOSIS — C50412 Malignant neoplasm of upper-outer quadrant of left female breast: Secondary | ICD-10-CM | POA: Diagnosis not present

## 2020-03-31 DIAGNOSIS — C50812 Malignant neoplasm of overlapping sites of left female breast: Secondary | ICD-10-CM | POA: Diagnosis not present

## 2020-04-01 ENCOUNTER — Ambulatory Visit
Admission: RE | Admit: 2020-04-01 | Discharge: 2020-04-01 | Disposition: A | Discharge status: Home / Self Care | Payer: BC Managed Care – PPO | Source: Ambulatory Visit | Attending: Radiation Oncology | Admitting: Radiation Oncology

## 2020-04-01 DIAGNOSIS — Z510 Encounter for antineoplastic radiation therapy: Secondary | ICD-10-CM | POA: Diagnosis not present

## 2020-04-01 DIAGNOSIS — C50812 Malignant neoplasm of overlapping sites of left female breast: Secondary | ICD-10-CM | POA: Diagnosis not present

## 2020-04-01 DIAGNOSIS — C50412 Malignant neoplasm of upper-outer quadrant of left female breast: Secondary | ICD-10-CM | POA: Diagnosis not present

## 2020-04-01 DIAGNOSIS — Z170 Estrogen receptor positive status [ER+]: Secondary | ICD-10-CM | POA: Diagnosis not present

## 2020-04-02 ENCOUNTER — Other Ambulatory Visit: Payer: Self-pay

## 2020-04-02 ENCOUNTER — Ambulatory Visit
Admission: RE | Admit: 2020-04-02 | Discharge: 2020-04-02 | Disposition: A | Discharge status: Home / Self Care | Payer: BC Managed Care – PPO | Source: Ambulatory Visit | Attending: Radiation Oncology | Admitting: Radiation Oncology

## 2020-04-02 DIAGNOSIS — Z510 Encounter for antineoplastic radiation therapy: Secondary | ICD-10-CM | POA: Insufficient documentation

## 2020-04-02 DIAGNOSIS — C50812 Malignant neoplasm of overlapping sites of left female breast: Secondary | ICD-10-CM | POA: Diagnosis not present

## 2020-04-02 DIAGNOSIS — Z170 Estrogen receptor positive status [ER+]: Secondary | ICD-10-CM | POA: Diagnosis not present

## 2020-04-02 DIAGNOSIS — C50412 Malignant neoplasm of upper-outer quadrant of left female breast: Secondary | ICD-10-CM | POA: Diagnosis not present

## 2020-04-05 ENCOUNTER — Other Ambulatory Visit: Payer: Self-pay

## 2020-04-05 ENCOUNTER — Ambulatory Visit
Admission: RE | Admit: 2020-04-05 | Discharge: 2020-04-05 | Disposition: A | Discharge status: Home / Self Care | Payer: BC Managed Care – PPO | Source: Ambulatory Visit | Attending: Radiation Oncology | Admitting: Radiation Oncology

## 2020-04-05 DIAGNOSIS — C50412 Malignant neoplasm of upper-outer quadrant of left female breast: Secondary | ICD-10-CM | POA: Diagnosis not present

## 2020-04-05 DIAGNOSIS — Z510 Encounter for antineoplastic radiation therapy: Secondary | ICD-10-CM | POA: Diagnosis not present

## 2020-04-05 DIAGNOSIS — Z170 Estrogen receptor positive status [ER+]: Secondary | ICD-10-CM | POA: Diagnosis not present

## 2020-04-05 DIAGNOSIS — C50812 Malignant neoplasm of overlapping sites of left female breast: Secondary | ICD-10-CM | POA: Diagnosis not present

## 2020-04-06 ENCOUNTER — Other Ambulatory Visit: Payer: Self-pay

## 2020-04-06 ENCOUNTER — Ambulatory Visit
Admission: RE | Admit: 2020-04-06 | Discharge: 2020-04-06 | Disposition: A | Discharge status: Home / Self Care | Payer: BC Managed Care – PPO | Source: Ambulatory Visit | Attending: Radiation Oncology | Admitting: Radiation Oncology

## 2020-04-06 DIAGNOSIS — Z170 Estrogen receptor positive status [ER+]: Secondary | ICD-10-CM | POA: Diagnosis not present

## 2020-04-06 DIAGNOSIS — C50412 Malignant neoplasm of upper-outer quadrant of left female breast: Secondary | ICD-10-CM | POA: Diagnosis not present

## 2020-04-06 DIAGNOSIS — Z510 Encounter for antineoplastic radiation therapy: Secondary | ICD-10-CM | POA: Diagnosis not present

## 2020-04-06 DIAGNOSIS — C50812 Malignant neoplasm of overlapping sites of left female breast: Secondary | ICD-10-CM | POA: Diagnosis not present

## 2020-04-07 ENCOUNTER — Ambulatory Visit
Admission: RE | Admit: 2020-04-07 | Discharge: 2020-04-07 | Disposition: A | Discharge status: Home / Self Care | Payer: BC Managed Care – PPO | Source: Ambulatory Visit | Attending: Radiation Oncology | Admitting: Radiation Oncology

## 2020-04-07 DIAGNOSIS — Z170 Estrogen receptor positive status [ER+]: Secondary | ICD-10-CM | POA: Diagnosis not present

## 2020-04-07 DIAGNOSIS — C50412 Malignant neoplasm of upper-outer quadrant of left female breast: Secondary | ICD-10-CM | POA: Diagnosis not present

## 2020-04-07 DIAGNOSIS — C50812 Malignant neoplasm of overlapping sites of left female breast: Secondary | ICD-10-CM | POA: Diagnosis not present

## 2020-04-07 DIAGNOSIS — Z510 Encounter for antineoplastic radiation therapy: Secondary | ICD-10-CM | POA: Diagnosis not present

## 2020-04-08 ENCOUNTER — Ambulatory Visit
Admission: RE | Admit: 2020-04-08 | Discharge: 2020-04-08 | Disposition: A | Discharge status: Home / Self Care | Payer: BC Managed Care – PPO | Source: Ambulatory Visit | Attending: Radiation Oncology | Admitting: Radiation Oncology

## 2020-04-08 DIAGNOSIS — C50812 Malignant neoplasm of overlapping sites of left female breast: Secondary | ICD-10-CM | POA: Diagnosis not present

## 2020-04-08 DIAGNOSIS — Z170 Estrogen receptor positive status [ER+]: Secondary | ICD-10-CM | POA: Diagnosis not present

## 2020-04-08 DIAGNOSIS — Z510 Encounter for antineoplastic radiation therapy: Secondary | ICD-10-CM | POA: Diagnosis not present

## 2020-04-08 DIAGNOSIS — C50412 Malignant neoplasm of upper-outer quadrant of left female breast: Secondary | ICD-10-CM | POA: Diagnosis not present

## 2020-04-09 ENCOUNTER — Other Ambulatory Visit: Payer: Self-pay

## 2020-04-09 ENCOUNTER — Ambulatory Visit
Admission: RE | Admit: 2020-04-09 | Discharge: 2020-04-09 | Disposition: A | Discharge status: Home / Self Care | Payer: BC Managed Care – PPO | Source: Ambulatory Visit | Attending: Radiation Oncology | Admitting: Radiation Oncology

## 2020-04-09 DIAGNOSIS — Z170 Estrogen receptor positive status [ER+]: Secondary | ICD-10-CM | POA: Diagnosis not present

## 2020-04-09 DIAGNOSIS — C50412 Malignant neoplasm of upper-outer quadrant of left female breast: Secondary | ICD-10-CM | POA: Diagnosis not present

## 2020-04-09 DIAGNOSIS — C50812 Malignant neoplasm of overlapping sites of left female breast: Secondary | ICD-10-CM | POA: Diagnosis not present

## 2020-04-09 DIAGNOSIS — Z510 Encounter for antineoplastic radiation therapy: Secondary | ICD-10-CM | POA: Diagnosis not present

## 2020-04-12 ENCOUNTER — Ambulatory Visit
Admission: RE | Admit: 2020-04-12 | Discharge: 2020-04-12 | Disposition: A | Discharge status: Home / Self Care | Payer: BC Managed Care – PPO | Source: Ambulatory Visit | Attending: Radiation Oncology | Admitting: Radiation Oncology

## 2020-04-12 ENCOUNTER — Other Ambulatory Visit: Payer: Self-pay

## 2020-04-12 DIAGNOSIS — Z170 Estrogen receptor positive status [ER+]: Secondary | ICD-10-CM | POA: Diagnosis not present

## 2020-04-12 DIAGNOSIS — Z510 Encounter for antineoplastic radiation therapy: Secondary | ICD-10-CM | POA: Diagnosis not present

## 2020-04-12 DIAGNOSIS — C50812 Malignant neoplasm of overlapping sites of left female breast: Secondary | ICD-10-CM | POA: Diagnosis not present

## 2020-04-12 DIAGNOSIS — C50412 Malignant neoplasm of upper-outer quadrant of left female breast: Secondary | ICD-10-CM | POA: Diagnosis not present

## 2020-04-13 ENCOUNTER — Ambulatory Visit: Payer: BC Managed Care – PPO

## 2020-04-13 ENCOUNTER — Ambulatory Visit
Admission: RE | Admit: 2020-04-13 | Discharge: 2020-04-13 | Disposition: A | Discharge status: Home / Self Care | Payer: BC Managed Care – PPO | Source: Ambulatory Visit | Attending: Radiation Oncology | Admitting: Radiation Oncology

## 2020-04-13 DIAGNOSIS — C50812 Malignant neoplasm of overlapping sites of left female breast: Secondary | ICD-10-CM | POA: Diagnosis not present

## 2020-04-13 DIAGNOSIS — C50412 Malignant neoplasm of upper-outer quadrant of left female breast: Secondary | ICD-10-CM | POA: Diagnosis not present

## 2020-04-13 DIAGNOSIS — Z510 Encounter for antineoplastic radiation therapy: Secondary | ICD-10-CM | POA: Diagnosis not present

## 2020-04-13 DIAGNOSIS — Z170 Estrogen receptor positive status [ER+]: Secondary | ICD-10-CM | POA: Diagnosis not present

## 2020-04-14 ENCOUNTER — Ambulatory Visit: Payer: BC Managed Care – PPO

## 2020-04-14 ENCOUNTER — Other Ambulatory Visit: Payer: Self-pay

## 2020-04-14 ENCOUNTER — Ambulatory Visit
Admission: RE | Admit: 2020-04-14 | Discharge: 2020-04-14 | Disposition: A | Discharge status: Home / Self Care | Payer: BC Managed Care – PPO | Source: Ambulatory Visit | Attending: Radiation Oncology | Admitting: Radiation Oncology

## 2020-04-14 DIAGNOSIS — C50412 Malignant neoplasm of upper-outer quadrant of left female breast: Secondary | ICD-10-CM | POA: Diagnosis not present

## 2020-04-14 DIAGNOSIS — Z170 Estrogen receptor positive status [ER+]: Secondary | ICD-10-CM | POA: Diagnosis not present

## 2020-04-14 DIAGNOSIS — C50812 Malignant neoplasm of overlapping sites of left female breast: Secondary | ICD-10-CM | POA: Diagnosis not present

## 2020-04-14 DIAGNOSIS — Z510 Encounter for antineoplastic radiation therapy: Secondary | ICD-10-CM | POA: Diagnosis not present

## 2020-04-15 ENCOUNTER — Ambulatory Visit: Payer: BC Managed Care – PPO

## 2020-04-15 ENCOUNTER — Ambulatory Visit
Admission: RE | Admit: 2020-04-15 | Discharge: 2020-04-15 | Disposition: A | Discharge status: Home / Self Care | Payer: BC Managed Care – PPO | Source: Ambulatory Visit | Attending: Radiation Oncology | Admitting: Radiation Oncology

## 2020-04-15 DIAGNOSIS — Z510 Encounter for antineoplastic radiation therapy: Secondary | ICD-10-CM | POA: Diagnosis not present

## 2020-04-15 DIAGNOSIS — Z170 Estrogen receptor positive status [ER+]: Secondary | ICD-10-CM | POA: Diagnosis not present

## 2020-04-15 DIAGNOSIS — C50812 Malignant neoplasm of overlapping sites of left female breast: Secondary | ICD-10-CM | POA: Diagnosis not present

## 2020-04-16 ENCOUNTER — Ambulatory Visit
Admission: RE | Admit: 2020-04-16 | Discharge: 2020-04-16 | Disposition: A | Discharge status: Home / Self Care | Payer: BC Managed Care – PPO | Source: Ambulatory Visit | Attending: Radiation Oncology | Admitting: Radiation Oncology

## 2020-04-16 ENCOUNTER — Other Ambulatory Visit: Payer: Self-pay

## 2020-04-16 DIAGNOSIS — Z170 Estrogen receptor positive status [ER+]: Secondary | ICD-10-CM | POA: Diagnosis not present

## 2020-04-16 DIAGNOSIS — C50812 Malignant neoplasm of overlapping sites of left female breast: Secondary | ICD-10-CM | POA: Diagnosis not present

## 2020-04-16 DIAGNOSIS — Z510 Encounter for antineoplastic radiation therapy: Secondary | ICD-10-CM | POA: Diagnosis not present

## 2020-04-19 ENCOUNTER — Ambulatory Visit
Admission: RE | Admit: 2020-04-19 | Discharge: 2020-04-19 | Disposition: A | Discharge status: Home / Self Care | Payer: BC Managed Care – PPO | Source: Ambulatory Visit | Attending: Radiation Oncology | Admitting: Radiation Oncology

## 2020-04-19 ENCOUNTER — Ambulatory Visit: Payer: BC Managed Care – PPO

## 2020-04-19 DIAGNOSIS — C50812 Malignant neoplasm of overlapping sites of left female breast: Secondary | ICD-10-CM | POA: Diagnosis not present

## 2020-04-19 DIAGNOSIS — Z170 Estrogen receptor positive status [ER+]: Secondary | ICD-10-CM | POA: Diagnosis not present

## 2020-04-19 DIAGNOSIS — Z510 Encounter for antineoplastic radiation therapy: Secondary | ICD-10-CM | POA: Diagnosis not present

## 2020-04-20 ENCOUNTER — Ambulatory Visit (INDEPENDENT_AMBULATORY_CARE_PROVIDER_SITE_OTHER): Payer: BC Managed Care – PPO | Admitting: Family Medicine

## 2020-04-20 ENCOUNTER — Other Ambulatory Visit: Payer: Self-pay

## 2020-04-20 ENCOUNTER — Encounter: Payer: Self-pay | Admitting: Family Medicine

## 2020-04-20 ENCOUNTER — Ambulatory Visit: Payer: BC Managed Care – PPO

## 2020-04-20 ENCOUNTER — Ambulatory Visit
Admission: RE | Admit: 2020-04-20 | Discharge: 2020-04-20 | Disposition: A | Discharge status: Home / Self Care | Payer: BC Managed Care – PPO | Source: Ambulatory Visit | Attending: Radiation Oncology | Admitting: Radiation Oncology

## 2020-04-20 VITALS — BP 160/80 | HR 65 | Temp 97.6°F | Ht 63.0 in | Wt 175.9 lb

## 2020-04-20 DIAGNOSIS — I10 Essential (primary) hypertension: Secondary | ICD-10-CM | POA: Diagnosis not present

## 2020-04-20 DIAGNOSIS — Z170 Estrogen receptor positive status [ER+]: Secondary | ICD-10-CM | POA: Diagnosis not present

## 2020-04-20 DIAGNOSIS — Z510 Encounter for antineoplastic radiation therapy: Secondary | ICD-10-CM | POA: Diagnosis not present

## 2020-04-20 DIAGNOSIS — C50812 Malignant neoplasm of overlapping sites of left female breast: Secondary | ICD-10-CM | POA: Diagnosis not present

## 2020-04-20 MED ORDER — AMLODIPINE BESYLATE 10 MG PO TABS: 10.0000 mg | ORAL_TABLET | Freq: Every day | ORAL | 1 refills | Status: DC

## 2020-04-20 NOTE — Progress Notes (Signed)
Margaret Best - 60 y.o. female MRN [id number]  Date of birth: [birth year]  Subjective Chief Complaint  Patient presents with  . Follow-up    HPI  Margaret Best is a 60 y.o. female here today for follow up of HTN.  Losartan d/c'ed at last visit as she was concerned that this may be causing cough.  We changed to amlodipine 5mg  and continue HCTZ 25mg  and metoprolol 100mg .  Cough has not really improved.  She is tolerating amlodipine well and would be willing to increase this to help with BP.  She also admits that stress from cancer treatment is contributing.  She completes her last radiation treatment tomorrow.  She denies symptoms related to HTN including chest pain, shortness of breath, palpitations, headache or vision changes.   ROS:  A comprehensive ROS was completed and negative except as noted per HPI  No Known Allergies  Past Medical History:  Diagnosis Date  . Ectopic pregnancy    left  . Hormone disorder    HYPOTHYROIDISM  . Hypertension   . Missed ab    FIRST TRIMESTER  . Thyroid disease   . Toxemia     Past Surgical History:  Procedure Laterality Date  . BREAST LUMPECTOMY Left   . CESAREAN SECTION    . DILATION AND CURETTAGE OF UTERUS    . ENTEROLYSIS  1988   SBO  . SALPINGECTOMY  1987   left  . TUBAL LIGATION      Social History   Socioeconomic History  . Marital status: Divorced    Spouse name: Not on file  . Number of children: 2  . Years of education: Not on file  . Highest education level: Not on file  Occupational History  . Not on file  Tobacco Use  . Smoking status: Never Smoker  . Smokeless tobacco: Never Used  Vaping Use  . Vaping Use: Never used  Substance and Sexual Activity  . Alcohol use: Yes    Alcohol/week: 5.0 standard drinks    Types: 5 Glasses of wine per week  . Drug use: No  . Sexual activity: Not Currently    Comment: 1st intercourse 60 yo-More than 5 partners  Other Topics Concern  . Not on file  Social History  Narrative  . Not on file   Social Determinants of Health   Financial Resource Strain: Not on file  Food Insecurity: Not on file  Transportation Needs: Not on file  Physical Activity: Not on file  Stress: Not on file  Social Connections: Not on file    Family History  Problem Relation Age of Onset  . Hypertension Mother   . Stroke Mother   . Heart failure Mother   . Diabetes Father     Health Maintenance  Topic Date Due  . Hepatitis C Screening  Never done  . HIV Screening  Never done  . COLONOSCOPY (Pts 45-85yrs Insurance coverage will need to be confirmed)  Never done  . PAP SMEAR-Modifier  07/08/2018  . INFLUENZA VACCINE  08/02/2020  . COVID-19 Vaccine (4 - Booster for Pfizer series) 09/08/2020  . MAMMOGRAM  01/27/2022  . TETANUS/TDAP  01/06/2025  . HPV VACCINES  Aged Out     ----------------------------------------------------------------------------------------------------------------------------------------------------------------------------------------------------------------- Physical Exam BP (!) 160/80 (BP Location: Left Arm, Patient Position: Sitting, Cuff Size: Normal)   Pulse 65   Temp 97.6 F (36.4 C)   Ht 5\' 3"  (1.6 m)   Wt 175 lb 14.4 oz (79.8 kg)   LMP 01/20/2012  SpO2 99%   BMI 31.16 kg/m   Physical Exam Constitutional:      Appearance: Normal appearance.  Eyes:     General: No scleral icterus. Cardiovascular:     Rate and Rhythm: Normal rate and regular rhythm.  Pulmonary:     Effort: Pulmonary effort is normal.     Breath sounds: Normal breath sounds.  Musculoskeletal:     Cervical back: Neck supple.  Neurological:     General: No focal deficit present.     Mental Status: She is alert.  Psychiatric:        Mood and Affect: Mood normal.        Behavior: Behavior normal.      ------------------------------------------------------------------------------------------------------------------------------------------------------------------------------------------------------------------- Assessment and Plan  HTN (hypertension) BP remains elevated.  Stress related to cancer may be contributing, hopefully this will subside some once she completes radiation.  I will have her increase her amlodipine to 10mg  and continue hctz and metoprolol.  Low salt diet stressed as well.  Return in about 6 weeks (around 06/01/2020) for HTN.    Meds ordered this encounter  Medications  . amLODipine (NORVASC) 10 MG tablet    Sig: Take 1 tablet (10 mg total) by mouth daily.    Dispense:  90 tablet    Refill:  1    Return in about 6 weeks (around 06/01/2020) for HTN.    This visit occurred during the SARS-CoV-2 public health emergency.  Safety protocols were in place, including screening questions prior to the visit, additional usage of staff PPE, and extensive cleaning of exam room while observing appropriate contact time as indicated for disinfecting solutions.

## 2020-04-20 NOTE — Patient Instructions (Signed)
Great to see you! Increase amlodipine to 10mg  daily.  Have labs completed See me again in 6 weeks.

## 2020-04-20 NOTE — Assessment & Plan Note (Signed)
BP remains elevated.  Stress related to cancer may be contributing, hopefully this will subside some once she completes radiation.  I will have her increase her amlodipine to 10mg  and continue hctz and metoprolol.  Low salt diet stressed as well.  Return in about 6 weeks (around 06/01/2020) for HTN.

## 2020-04-21 ENCOUNTER — Ambulatory Visit
Admission: RE | Admit: 2020-04-21 | Discharge: 2020-04-21 | Disposition: A | Discharge status: Home / Self Care | Payer: BC Managed Care – PPO | Source: Ambulatory Visit | Attending: Radiation Oncology | Admitting: Radiation Oncology

## 2020-04-21 ENCOUNTER — Encounter: Payer: Self-pay | Admitting: Radiation Oncology

## 2020-04-21 DIAGNOSIS — Z170 Estrogen receptor positive status [ER+]: Secondary | ICD-10-CM | POA: Diagnosis not present

## 2020-04-21 DIAGNOSIS — C50812 Malignant neoplasm of overlapping sites of left female breast: Secondary | ICD-10-CM | POA: Diagnosis not present

## 2020-04-21 DIAGNOSIS — Z510 Encounter for antineoplastic radiation therapy: Secondary | ICD-10-CM | POA: Diagnosis not present

## 2020-04-21 DIAGNOSIS — C50412 Malignant neoplasm of upper-outer quadrant of left female breast: Secondary | ICD-10-CM | POA: Diagnosis not present

## 2020-04-21 LAB — BASIC METABOLIC PANEL
BUN: 13 mg/dL (ref 7–25)
CO2: 31 mmol/L (ref 20–32)
Calcium: 10.4 mg/dL (ref 8.6–10.4)
Chloride: 101 mmol/L (ref 98–110)
Creat: 0.85 mg/dL (ref 0.50–0.99)
Glucose, Bld: 97 mg/dL (ref 65–99)
Potassium: 4.8 mmol/L (ref 3.5–5.3)
Sodium: 140 mmol/L (ref 135–146)

## 2020-04-28 NOTE — Progress Notes (Signed)
[place]   [phone] Fax:([phone]) [phone]   Clinic Follow up Note   Patient Care Team: [name], DO as PCP - General (Family Medicine) [name], MD as Consulting Physician (Hematology) [name], MD as Attending Physician (Radiation Oncology)  Date of Service:  05/03/2020  CHIEF COMPLAINT: F/u of left breast cancer  SUMMARY OF ONCOLOGIC HISTORY: Oncology History Overview Note  Cancer Staging Carcinoma of upper-outer quadrant of left breast in female, estrogen receptor positive ([place]) Staging form: Breast, AJCC 8th Edition - Pathologic stage from 03/08/2020: Stage IA (pT1b, pN0, cM0, G1, ER+, PR+, HER2-) - Signed by [name], MD on 03/08/2020 Stage prefix: Initial diagnosis Histologic grading system: 3 grade system    Carcinoma of upper-outer quadrant of left breast in female, estrogen receptor positive ([place])  12/08/2019 Breast US   Left breast US 12/08/19 Findings:  At the left 12:00 position 7cmfn there is a small mass 1.2x1x0.7cm with spiculated margins and associated calcifications.    01/25/2020 Breast MRI   MRI Breast 01/25/20 Impression Left bresat with lobular enhanceing mass 11:00 position, 6.3cm from nipple, measuring 0.8x1.4x1cm. This is suspicious.  There is linear ductal enhanement extending posteriorly 1.1cm and 2 areas of linear non-mass enhancent extending exteriorly measuring 0.8 and 3.1cm in length. There may represent insitue cancer.    01/28/2020 Initial Biopsy   OUTSIDE SCAN   02/06/2020 Surgery   Left Lumpectomy by Dr Regan Rakers     02/12/2020 Surgery   Left breast re-excision surgery by Dr Regan Rakers     03/08/2020 Initial Diagnosis   Carcinoma of upper-outer quadrant of left breast in female, estrogen receptor positive ([place])   03/08/2020 Cancer Staging   Staging form: Breast, AJCC 8th Edition - Pathologic stage from 03/08/2020: Stage IA (pT1b, pN0, cM0, G1, ER+, PR+, HER2-) - Signed by [name], MD on  03/08/2020 Stage prefix: Initial diagnosis Histologic grading system: 3 grade system   03/25/2020 - 04/21/2020 Radiation Therapy   Adjuvant radiation with Dr Isidore Moos    05/2020 -  Anti-estrogen oral therapy   Anastrozole 73m daily starting in 05/2020      CURRENT THERAPY:  Anastrozole 156mdaily starting in 05/2020  INTERVAL HISTORY:  Margaret Best here for a follow up of left breast cancer. She presents to the clinic alone. She notes her breast radiation went well. She was mildly fatigued and skin discoloration. She tolerated well overall. She is interested in more topical cream for her skin. She does note breast pain occasionally.  She notes she has not taken her HTN medications yet. She plans to take it at 10am. This is managed by her PCP. She notes she checks her BP at home and will in 140/80 range.   REVIEW OF SYSTEMS:   Constitutional: Denies fevers, chills or abnormal weight loss Eyes: Denies blurriness of vision Ears, nose, mouth, throat, and face: Denies mucositis or sore throat Respiratory: Denies cough, dyspnea or wheezes Cardiovascular: Denies palpitation, chest discomfort or lower extremity swelling Gastrointestinal:  Denies nausea, heartburn or change in bowel habits Skin: Denies abnormal skin rashes Lymphatics: Denies new lymphadenopathy or easy bruising Neurological:Denies numbness, tingling or new weaknesses Behavioral/Psych: Mood is stable, no new changes  All other systems were reviewed with the patient and are negative.  MEDICAL HISTORY:  Past Medical History:  Diagnosis Date  . Ectopic pregnancy    left  . Hormone disorder    HYPOTHYROIDISM  . Hypertension   . Missed ab    FIRST TRIMESTER  .  Thyroid disease   . Toxemia     SURGICAL HISTORY: Past Surgical History:  Procedure Laterality Date  . BREAST LUMPECTOMY Left   . CESAREAN SECTION    . DILATION AND CURETTAGE OF UTERUS    . ENTEROLYSIS  1988   SBO  . SALPINGECTOMY  1987   left  . TUBAL  LIGATION      I have reviewed the social history and family history with the patient and they are unchanged from previous note.  ALLERGIES:  has No Known Allergies.  MEDICATIONS:  Current Outpatient Medications  Medication Sig Dispense Refill  . anastrozole (ARIMIDEX) 1 MG tablet Take 1 tablet (1 mg total) by mouth daily. 90 tablet 1  . amLODipine (NORVASC) 10 MG tablet Take 1 tablet (10 mg total) by mouth daily. 90 tablet 1  . hydrochlorothiazide (HYDRODIURIL) 25 MG tablet Take 1 tablet (25 mg total) by mouth daily. 90 tablet 1  . levothyroxine (SYNTHROID, LEVOTHROID) 75 MCG tablet Take 75 mcg by mouth daily.    Marland Kitchen LORazepam (ATIVAN) 0.5 MG tablet Take 1 tablet (0.5 mg total) by mouth daily as needed for anxiety. Before radiation as needed 15 tablet 0  . metoprolol tartrate (LOPRESSOR) 100 MG tablet Take 100 mg by mouth 2 (two) times daily.     No current facility-administered medications for this visit.    PHYSICAL EXAMINATION: ECOG PERFORMANCE STATUS: 0 - Asymptomatic  Vitals:   05/03/20 0913 05/03/20 0943  BP: (!) 164/104 (!) 163/99  Pulse: (!) 104   Resp: 19   Temp: (!) 97.1 F (36.2 C)   SpO2: 98%    Filed Weights   05/03/20 0913  Weight: 174 lb 8 oz (79.2 kg)    GENERAL:alert, no distress and comfortable SKIN: skin color, texture, turgor are normal, no rashes or significant lesions EYES: normal, Conjunctiva are pink and non-injected, sclera clear  NECK: supple, thyroid normal size, non-tender, without nodularity LYMPH:  no palpable lymphadenopathy in the cervical, axillary  LUNGS: clear to auscultation and percussion with normal breathing effort HEART: regular rate & rhythm and no murmurs and no lower extremity edema ABDOMEN:abdomen soft, non-tender and normal bowel sounds Musculoskeletal:no cyanosis of digits and no clubbing  NEURO: alert & oriented x 3 with fluent speech, no focal motor/sensory deficits BREAST: S/p left lumpectomy: surgical incision healed well  with scar tissue (+) Left breast skin hyperpigmentation. No palpable mass, nodules or adenopathy bilaterally. Breast exam benign.   LABORATORY DATA:  I have reviewed the data as listed CBC Latest Ref Rng & Units 05/03/2020 12/18/2017 01/25/2012  WBC 4.0 - 10.5 K/uL 6.9 13.0(H) 7.2  Hemoglobin 12.0 - 15.0 g/dL 14.5 13.1 15.3(H)  Hematocrit 36.0 - 46.0 % 43.8 40.5 44.6  Platelets 150 - 400 K/uL 209 241 298     CMP Latest Ref Rng & Units 05/03/2020 04/20/2020 12/18/2017  Glucose 70 - 99 mg/dL 116(H) 97 95  BUN 6 - 20 mg/dL '16 13 17  ' Creatinine 0.44 - 1.00 mg/dL 1.07(H) 0.85 0.78  Sodium 135 - 145 mmol/L 142 140 137  Potassium 3.5 - 5.1 mmol/L 4.4 4.8 3.4(L)  Chloride 98 - 111 mmol/L 102 101 103  CO2 22 - 32 mmol/L '28 31 26  ' Calcium 8.9 - 10.3 mg/dL 10.1 10.4 9.2  Total Protein 6.5 - 8.1 g/dL 7.7 - -  Total Bilirubin 0.3 - 1.2 mg/dL 0.5 - -  Alkaline Phos 38 - 126 U/L 57 - -  AST 15 - 41 U/L 26 - -  ALT 0 - 44 U/L 24 - -      RADIOGRAPHIC STUDIES: I have personally reviewed the radiological images as listed and agreed with the findings in the report. No results found.   ASSESSMENT & PLAN:  Margaret Best is a 60 y.o. female with    1. Left breast cancer, Stage IA, p(T1bN0M0), ER+/PR+/HER2-, Grade I  -She was diagnosed in 01/2020. She underwent left lumpectomy by Dr Johny Blamer in [place] on 02/06/20 and re-excision on 02/12/20 for close margin and hematoma evacuation. Surgical path showed 0.9 mm invasive ductal carcinoma, grade 1, post ER and PR positive, HER-2 negative, with negative margins and negative lymph nodes. She overall has low risk of recurrence, oncotype not recommended.  -She recently completed adjuvant radiation with Dr Isidore Moos. Tolerated well with mild skin change.  -Given the strong ER and PR positivity, I do recommend adjuvant aromatase inhibitor with anastrozole for 5-7 years to reduce her risk of cancer recurrence  -The potential benefit and side effects, which includes  but not limited to, hot flash, skin and vaginal dryness, metabolic changes ( increased blood glucose, cholesterol, weight, etc.), slightly in increased risk of cardiovascular disease, cataracts, muscular and joint discomfort, osteopenia and osteoporosis, etc, were discussed with her in great details. She agreed. Plan to start in a few weeks -She is clinically doing well. Labs reviewed, CBC and CMP WNL. Physical exam benign with mild breast changes s/p surgery and radiation. There is no clinical concern for recurrence.  -Continue surveillance. Next mammogram in 11/2020.  -She will proceed with survivorship clinic with NP Lacie in 3 months. F/u with me in 6 months    2. HTN and hypothyroidism -Continue medication, follow-up with PCP. -Her BP is elevated at 164/104 today (05/03/20). She has not taken her medications yet this morning. She notes her home BP range is 140/80. I encouraged her to continue to monitor.  -She is interested in losing weight. I recommend change in diet and increased exercise. To help manage her diet, I will refer to dietician. She is interested in this.    3. Bone health  -Will proceed with baseline DEXA on 05/05/20. Will repeat every 2 years.  -We discussed the negative impact on her bone density from aromatase inhibitor. -I encouraged her to start calcium and vitamin D supplement.   PLAN:  -Send dietician referral, she is interested in healthy diet for cancer prevention  -DEXA on 05/05/20  -I called in Anastrozole today. Can start sometime in mid May.  -Survivorship clinic with NP Lacie in 3 months  -Lab and F/u with me in 6 months    No problem-specific Assessment & Plan notes found for this encounter.   Orders Placed This Encounter  Procedures  . Ambulatory Referral to [facility]    Referral Priority:   Routine    Referral Type:   Consultation    Referral Reason:   Specialty Services Required    Requested Specialty:   Oncology    Number of Visits  Requested:   1   All questions were answered. The patient knows to call the clinic with any problems, questions or concerns. No barriers to learning was detected. The total time spent in the appointment was 30 minutes.     [name], MD 05/03/2020   I, Joslyn Devon, am acting as scribe for [name], MD.   I have reviewed the above documentation for accuracy and completeness, and I agree with the above.

## 2020-04-30 ENCOUNTER — Other Ambulatory Visit: Payer: Self-pay

## 2020-04-30 DIAGNOSIS — Z170 Estrogen receptor positive status [ER+]: Secondary | ICD-10-CM

## 2020-05-03 ENCOUNTER — Encounter: Payer: Self-pay | Admitting: Hematology

## 2020-05-03 ENCOUNTER — Telehealth: Payer: Self-pay | Admitting: Hematology

## 2020-05-03 ENCOUNTER — Inpatient Hospital Stay: Payer: BC Managed Care – PPO | Attending: Radiation Oncology

## 2020-05-03 ENCOUNTER — Other Ambulatory Visit: Payer: Self-pay

## 2020-05-03 ENCOUNTER — Inpatient Hospital Stay: Payer: BC Managed Care – PPO | Admitting: Hematology

## 2020-05-03 VITALS — BP 163/99 | HR 104 | Temp 97.1°F | Resp 19 | Wt 174.5 lb

## 2020-05-03 DIAGNOSIS — Z79811 Long term (current) use of aromatase inhibitors: Secondary | ICD-10-CM | POA: Diagnosis not present

## 2020-05-03 DIAGNOSIS — C50412 Malignant neoplasm of upper-outer quadrant of left female breast: Secondary | ICD-10-CM

## 2020-05-03 DIAGNOSIS — I10 Essential (primary) hypertension: Secondary | ICD-10-CM | POA: Insufficient documentation

## 2020-05-03 DIAGNOSIS — Z170 Estrogen receptor positive status [ER+]: Secondary | ICD-10-CM | POA: Insufficient documentation

## 2020-05-03 DIAGNOSIS — Z923 Personal history of irradiation: Secondary | ICD-10-CM | POA: Diagnosis not present

## 2020-05-03 LAB — CMP (CANCER CENTER ONLY)
ALT: 24 U/L (ref 0–44)
AST: 26 U/L (ref 15–41)
Albumin: 4.1 g/dL (ref 3.5–5.0)
Alkaline Phosphatase: 57 U/L (ref 38–126)
Anion gap: 12 (ref 5–15)
BUN: 16 mg/dL (ref 6–20)
CO2: 28 mmol/L (ref 22–32)
Calcium: 10.1 mg/dL (ref 8.9–10.3)
Chloride: 102 mmol/L (ref 98–111)
Creatinine: 1.07 mg/dL — ABNORMAL HIGH (ref 0.44–1.00)
GFR, Estimated: 59 mL/min — ABNORMAL LOW (ref >60)
Glucose, Bld: 116 mg/dL — ABNORMAL HIGH (ref 70–99)
Potassium: 4.4 mmol/L (ref 3.5–5.1)
Sodium: 142 mmol/L (ref 135–145)
Total Bilirubin: 0.5 mg/dL (ref 0.3–1.2)
Total Protein: 7.7 g/dL (ref 6.5–8.1)

## 2020-05-03 LAB — CBC WITH DIFFERENTIAL (CANCER CENTER ONLY)
Abs Immature Granulocytes: 0.01 10*3/uL (ref 0.00–0.07)
Basophils Absolute: 0 10*3/uL (ref 0.0–0.1)
Basophils Relative: 1 %
Eosinophils Absolute: 0.1 10*3/uL (ref 0.0–0.5)
Eosinophils Relative: 1 %
HCT: 43.8 % (ref 36.0–46.0)
Hemoglobin: 14.5 g/dL (ref 12.0–15.0)
Immature Granulocytes: 0 %
Lymphocytes Relative: 23 %
Lymphs Abs: 1.6 10*3/uL (ref 0.7–4.0)
MCH: 31.1 pg (ref 26.0–34.0)
MCHC: 33.1 g/dL (ref 30.0–36.0)
MCV: 94 fL (ref 80.0–100.0)
Monocytes Absolute: 0.6 10*3/uL (ref 0.1–1.0)
Monocytes Relative: 8 %
Neutro Abs: 4.6 10*3/uL (ref 1.7–7.7)
Neutrophils Relative %: 67 %
Platelet Count: 209 10*3/uL (ref 150–400)
RBC: 4.66 MIL/uL (ref 3.87–5.11)
RDW: 12.3 % (ref 11.5–15.5)
WBC Count: 6.9 10*3/uL (ref 4.0–10.5)
nRBC: 0 % (ref 0.0–0.2)

## 2020-05-03 MED ORDER — ANASTROZOLE 1 MG PO TABS: 1.0000 mg | ORAL_TABLET | Freq: Every day | ORAL | 1 refills | Status: DC

## 2020-05-03 NOTE — Telephone Encounter (Signed)
Scheduled appt per 5/2 sch msg. Pt aware.  

## 2020-05-03 NOTE — Telephone Encounter (Signed)
Scheduled per los. Confirmed appts. Declined printout  

## 2020-05-04 ENCOUNTER — Ambulatory Visit: Payer: BC Managed Care – PPO | Attending: Hematology | Admitting: Physical Therapy

## 2020-05-04 DIAGNOSIS — Z483 Aftercare following surgery for neoplasm: Secondary | ICD-10-CM | POA: Diagnosis not present

## 2020-05-04 DIAGNOSIS — M25612 Stiffness of left shoulder, not elsewhere classified: Secondary | ICD-10-CM | POA: Insufficient documentation

## 2020-05-04 DIAGNOSIS — M6281 Muscle weakness (generalized): Secondary | ICD-10-CM | POA: Insufficient documentation

## 2020-05-04 NOTE — Therapy (Signed)
[place], [place], [address] Phone: [phone]   Fax:  [phone]  Physical Therapy Treatment  Patient Details  Name: Margaret Best MRN: [id number] Date of Birth: May 21, 1960 Referring Provider (PT): Dr. Burr Medico   Encounter Date: 05/04/2020   PT End of Session - 05/04/20 1646    Visit Number 2    Number of Visits 9    Date for PT Re-Evaluation 06/03/20    PT Start Time 8891    PT Stop Time 1646    PT Time Calculation (min) 31 min    Activity Tolerance Patient tolerated treatment well    Behavior During Therapy [facility] for tasks assessed/performed           Past Medical History:  Diagnosis Date  . Ectopic pregnancy    left  . Hormone disorder    HYPOTHYROIDISM  . Hypertension   . Missed ab    FIRST TRIMESTER  . Thyroid disease   . Toxemia     Past Surgical History:  Procedure Laterality Date  . BREAST LUMPECTOMY Left   . CESAREAN SECTION    . DILATION AND CURETTAGE OF UTERUS    . ENTEROLYSIS  1988   SBO  . SALPINGECTOMY  1987   left  . TUBAL LIGATION      There were no vitals filed for this visit.   Subjective Assessment - 05/04/20 1620    Subjective Pt completed radiation  2 weeks ago. She did not have any open areas, but the skin is darker. She is not feeling any fullness.   She is very fatigued today as she did not sleep well and there is alot going on at work She did dishes today and had soreness in her arm. She will be starting back at the office 2 days a week soon  She has not been exercising her shoulders and has not been walking for exercise. She says she does alot of walking in the house and chores    Pertinent History grade 1 invasive ductal carcinoma with associated high-grade DCIS, ER 90% positive, PR 45 to 50% positive, HER-2 negative.  She underwent lumpectomy and sentinel lymph node biopsy 0/5 on February 06, 2020 by Dr. Johny Blamer in [place]. She develped a hematoma so had to have a  second surgery to have that removed. She did not have chemotherapy and is currenrly  having radiation . past history includes high blood pressure and hypothyroidism  several years ago she had limitation of left shoulder that mostly resolved, but she has some tightness in limits of range of motion    Patient Stated Goals to learn exercise to prevent lymphedema    Currently in Pain? No/denies   just very fatigued             [facility] PT Assessment - 05/04/20 0001      Assessment   Medical Diagnosis left breast cancer      Observation/Other Assessments   Observations well healed incision and dry skin, discoloration on left breast, but no lymphedema or induration seen      AROM   Left Shoulder Flexion 165 Degrees    Left Shoulder ABduction 170 Degrees    Left Shoulder External Rotation 85 Degrees             LYMPHEDEMA/ONCOLOGY QUESTIONNAIRE - 05/04/20 0001      Left Upper Extremity Lymphedema   10 cm Proximal to Olecranon Process 28.5 cm    Olecranon Process 24  cm    15 cm Proximal to Ulnar Styloid Process 24 cm    Just Proximal to Ulnar Styloid Process 14.8 cm    Across Hand at [employer] 17.5 cm    At [place] of 2nd Digit 6 cm                      [facility] Adult PT Treatment/Exercise - 05/04/20 0001      Self-Care   Self-Care Other Self-Care Comments    Other Self-Care Comments  reissued information about ABC class and gave pt handouts for exercise. Also gave pt information from [facility] and encouraged her to do the yoga classes but especially to start walking working up to 30 minutes a day      Manual Therapy   Manual therapy comments remeasured arm circumference and shoulder ROM                       PT Long Term Goals - 05/04/20 1650      PT LONG TERM GOAL #1   Title Pt will be able to verbalize lymphedem risk reduction practices    Period Weeks    Status On-going      PT LONG TERM GOAL #2   Title Pt will be independent in a home  exercise program for UE stregthening to decrease lymphedema risk    Time 8    Period Weeks    Status On-going      PT LONG TERM GOAL #3   Title Pt will have pain free shoulder flexion of 160 degrees so that she can do overhead activities easier    Baseline 147 degrees  on eval  03/30/2020, 165 on 05/04/2020    Status Achieved                 Plan - 05/04/20 1652    Clinical Impression Statement Pt comes in to PT today 15 minutes late and appears to be fatigued. She says work has been very stressful and she anticipates it will be so for at least the next 3 weeks.  She has not been able to do any exercise or walking. she has improved her shoulder ROM and is not showing any signs of lymphedmea Encouraged her to walk at home and do some online yoga through [facility] and return to PT when work is less stressful and she will be able to focus on strengthening exercises that she can follow through with at home.    Comorbidities previous left shoulder limitation, lumpectomy x 2, current radiation    Examination-Activity Limitations Reach Overhead;Carry    Examination-Participation Restrictions Cleaning;Laundry    Stability/Clinical Decision Making Evolving/Moderate complexity    Rehab Potential Excellent    PT Frequency 2x / week    PT Duration 4 weeks    PT Treatment/Interventions ADLs/Self Care Home Management;Therapeutic activities;Therapeutic exercise;Neuromuscular re-education;Patient/family education;Manual techniques;Manual lymph drainage;Compression bandaging;Passive range of motion;Joint Manipulations;Taping;Orthotic Fit/Training    PT Next Visit Plan make sure she has attended ABC class, teach Strength ABC program and progressive resistive exericses    Consulted and Agree with Plan of Care Patient           Patient will benefit from skilled therapeutic intervention in order to improve the following deficits and impairments:  Decreased skin integrity,Impaired perceived  functional ability,Decreased range of motion,Decreased activity tolerance,Decreased strength,Increased fascial restricitons,Impaired UE functional use,Postural dysfunction,Pain  Visit Diagnosis: Aftercare following surgery for neoplasm  Stiffness of  left shoulder, not elsewhere classified  Muscle weakness (generalized)     Problem List Patient Active Problem List   Diagnosis Date Noted  . Carcinoma of upper-outer quadrant of left breast in female, estrogen receptor positive ([place]) 03/08/2020  . Fluctuating blood pressure 02/28/2018  . Libido, decreased 07/08/2015  . PMB (postmenopausal bleeding) 07/08/2015  . HTN (hypertension) 01/25/2012  . Hypothyroidism 01/25/2012   Donato Heinz. Owens Shark PT  Norwood Levo 05/04/2020, 4:55 PM  [place], [place], [address] Phone: [phone]   Fax:  [phone]  Name: Margaret Best MRN: [id number] Date of Birth: 1960/05/04

## 2020-05-05 ENCOUNTER — Other Ambulatory Visit: Payer: BC Managed Care – PPO

## 2020-05-06 ENCOUNTER — Encounter: Payer: BC Managed Care – PPO | Admitting: Physical Therapy

## 2020-05-07 ENCOUNTER — Inpatient Hospital Stay: Payer: BC Managed Care – PPO | Admitting: Dietician

## 2020-05-07 ENCOUNTER — Other Ambulatory Visit: Payer: Self-pay

## 2020-05-10 NOTE — Progress Notes (Signed)
Nutrition Assessment   Reason for Assessment: patient request   ASSESSMENT: 60 year old female completed adjuvant radiation therapy for left breast cancer on 4/20. Plans to start adjuvant Anastrozole.  Past medical history of HTN, Hypothyroidism.  Met with patient in clinic. She reports interest in food and nutrition education for cancer prevention s/p treatment for left breast cancer. Patient reports she does not cook much. She reports no regular meal patter, eats when is hungry, usually 2 meals and snacks. Yesterday, patient had yogurt, salad with endive, avocado and boiled egg, hot dog, snacked on grapes, bananas and ice cream. She reports a friend told her that she should not eat endive or tomatoes because these could cause cancer.  Patient "tries" to drink 64 oz of water daily. She occasionally will have 1-2 glasses of wine. She works from home, reports long days (~15 hours at times) in front of the computer. Patient is not physically active, she has been working with PT s/p lumpectomy, reports PT encouraged yoga.    Medications: reviewed   Labs: reviewed   Anthropometrics:   Height: 5'3" Weight: 79.2 kg UBW: 175-180 lbs (09/20-03/22) BMI: 30.91   NUTRITION DIAGNOSIS: Food and nutrition knowledge related deficit related to dietary recommendations for cancer prevention as evidenced by patient seeking nutrition education  INTERVENTION:  Provided education on plant-based diet, handouts given Discussed evidenced based food and nutrition recommendations, handouts provided Encouraged increased activity as able Discussed upcoming events/classes available to patient, handouts provided RD contact information provided  MONITORING, EVALUATION, GOAL:  Patient demonstrates understanding of food and nutrition recommendations for cancer prevention   Next Visit: No follow-up scheduled. Patient has contact information

## 2020-05-11 ENCOUNTER — Encounter: Payer: BC Managed Care – PPO | Admitting: Physical Therapy

## 2020-05-18 ENCOUNTER — Encounter: Payer: BC Managed Care – PPO | Admitting: Physical Therapy

## 2020-05-18 DIAGNOSIS — M79672 Pain in left foot: Secondary | ICD-10-CM | POA: Diagnosis not present

## 2020-05-19 ENCOUNTER — Ambulatory Visit
Admission: RE | Admit: 2020-05-19 | Discharge: 2020-05-19 | Disposition: A | Discharge status: Home / Self Care | Payer: BC Managed Care – PPO | Source: Ambulatory Visit | Attending: Hematology | Admitting: Hematology

## 2020-05-19 ENCOUNTER — Other Ambulatory Visit: Payer: Self-pay

## 2020-05-19 DIAGNOSIS — E2839 Other primary ovarian failure: Secondary | ICD-10-CM

## 2020-05-19 DIAGNOSIS — Z780 Asymptomatic menopausal state: Secondary | ICD-10-CM | POA: Diagnosis not present

## 2020-05-20 ENCOUNTER — Encounter: Payer: BC Managed Care – PPO | Admitting: Physical Therapy

## 2020-05-21 ENCOUNTER — Other Ambulatory Visit: Payer: Self-pay

## 2020-05-21 ENCOUNTER — Ambulatory Visit
Admission: RE | Admit: 2020-05-21 | Discharge: 2020-05-21 | Disposition: A | Discharge status: Home / Self Care | Payer: BC Managed Care – PPO | Source: Ambulatory Visit | Attending: Radiation Oncology | Admitting: Radiation Oncology

## 2020-05-21 ENCOUNTER — Encounter: Payer: Self-pay | Admitting: Hematology

## 2020-05-21 VITALS — BP 149/82 | HR 67 | Temp 97.0°F | Resp 18 | Ht 63.0 in | Wt 174.4 lb

## 2020-05-21 DIAGNOSIS — C50412 Malignant neoplasm of upper-outer quadrant of left female breast: Secondary | ICD-10-CM | POA: Insufficient documentation

## 2020-05-21 DIAGNOSIS — Z170 Estrogen receptor positive status [ER+]: Secondary | ICD-10-CM

## 2020-05-21 DIAGNOSIS — Z923 Personal history of irradiation: Secondary | ICD-10-CM | POA: Insufficient documentation

## 2020-05-21 DIAGNOSIS — Z79899 Other long term (current) drug therapy: Secondary | ICD-10-CM | POA: Insufficient documentation

## 2020-05-21 MED ORDER — RADIAPLEXRX EX GEL: Freq: Once | CUTANEOUS | Status: AC

## 2020-05-21 NOTE — Progress Notes (Addendum)
Margaret Best presents today for follow-up after completing radiation to her left breast on 04/21/2020  Pain: Reports occasional sharp pains near her surgical site (3-4 out of 10). States they resolve on their own, and she doesn't feel the need to take OTC pain medication  Skin: Reports skin has remained intact, but is now darker/hyperpigmented in treatment field ROM: Denies any issues Lymphedema: Denies any signs of swelling to arm, but does report occasional soreness/discomfort down her left arm. Reports she does her stretches recommended by PT and plans to resume PT sessions at the end of this month MedOnc F/U: Scheduled for Survivorship with Lacie Burton-NP in August  Other issues of note: Overall reports she's doing well. Denies any other issues or concerns  Pt reports Yes No Comments  Tamoxifen []  [x]    Letrozole []  [x]    Anastrazole [x] Plans to start ~2 weeks     []    Mammogram []  Date: TBBD by Dr. Burr Medico []     Wt Readings from Last 3 Encounters:  05/21/20 174 lb 6 oz (79.1 kg)  05/03/20 174 lb 8 oz (79.2 kg)  04/20/20 175 lb 14.4 oz (79.8 kg)   Vitals:   05/21/20 1039  BP: (!) 149/82  Pulse: 67  Resp: 18  Temp: (!) 97 F (36.1 C)  SpO2: 99%

## 2020-05-24 ENCOUNTER — Encounter: Payer: Self-pay | Admitting: Radiation Oncology

## 2020-05-24 NOTE — Progress Notes (Signed)
Radiation Oncology         ([phone] ________________________________  Name: Margaret Best MRN: [id number]  Date: 05/21/2020  DOB: 05/25/1960  Follow-Up Visit Note  Outpatient  CC: [name], DO  [name], DO  Diagnosis and Prior Radiotherapy:    ICD-10-CM   1. Carcinoma of upper-outer quadrant of left breast in female, estrogen receptor positive ([place])  C50.412 hyaluronate sodium (RADIAPLEXRX) gel   Z17.0     CHIEF COMPLAINT: Here for follow-up and surveillance of breast cancer  Narrative:  The patient returns today for routine follow-up.   Margaret Best presents today for follow-up after completing radiation to her left breast on 04/21/2020  Pain: Reports occasional sharp pains near her surgical site (3-4 out of 10). States they resolve on their own, and she doesn't feel the need to take OTC pain medication  Skin: Reports skin has remained intact, but is now darker/hyperpigmented in treatment field ROM: Denies any issues Lymphedema: Denies any signs of swelling to arm, but does report occasional soreness/discomfort down her left arm. Reports she does her stretches recommended by PT and plans to resume PT sessions at the end of this month MedOnc F/U: Scheduled for Survivorship with Lacie Burton-NP in August  Other issues of note: Overall reports she's doing well. Denies any other issues or concerns  Pt reports Yes No Comments  Tamoxifen []  [x]    Letrozole []  [x]    Anastrazole [x] Plans to start ~2 weeks     []    Mammogram []  Date: TBBD by Dr. Burr Medico []     Wt Readings from Last 3 Encounters:  05/21/20 174 lb 6 oz (79.1 kg)  05/03/20 174 lb 8 oz (79.2 kg)  04/20/20 175 lb 14.4 oz (79.8 kg)   Vitals:   05/21/20 1039  BP: (!) 149/82  Pulse: 67  Resp: 18  Temp: (!) 97 F (36.1 C)  SpO2: 99%                                 ALLERGIES:  has No Known Allergies.  Meds: Current Outpatient Medications  Medication Sig Dispense Refill  . amLODipine (NORVASC)  10 MG tablet Take 1 tablet (10 mg total) by mouth daily. 90 tablet 1  . anastrozole (ARIMIDEX) 1 MG tablet Take 1 tablet (1 mg total) by mouth daily. 90 tablet 1  . hydrochlorothiazide (HYDRODIURIL) 25 MG tablet Take 1 tablet (25 mg total) by mouth daily. 90 tablet 1  . levothyroxine (SYNTHROID, LEVOTHROID) 75 MCG tablet Take 75 mcg by mouth daily.    Marland Kitchen LORazepam (ATIVAN) 0.5 MG tablet Take 1 tablet (0.5 mg total) by mouth daily as needed for anxiety. Before radiation as needed 15 tablet 0  . losartan-hydrochlorothiazide (HYZAAR) 100-12.5 MG tablet Take 1 tablet by mouth daily.    . metoprolol tartrate (LOPRESSOR) 100 MG tablet Take 100 mg by mouth 2 (two) times daily.     No current facility-administered medications for this encounter.    Physical Findings: The patient is in no acute distress. Patient is alert and oriented.  height is 5\' 3"  (1.6 m) and weight is 174 lb 6 oz (79.1 kg). Her temporal temperature is 97 F (36.1 C) (abnormal). Her blood pressure is 149/82 (abnormal) and her pulse is 67. Her respiration is 18 and oxygen saturation is 99%. .    Left breast:  Skin is still demonstrating resolving dryness and hyperpigmentation.   Lab Findings: Lab Results  Component Value Date   WBC 6.9 05/03/2020   HGB 14.5 05/03/2020   HCT 43.8 05/03/2020   MCV 94.0 05/03/2020   PLT 209 05/03/2020    Radiographic Findings: DG Bone Density  Result Date: 05/19/2020 EXAM: DUAL X-RAY ABSORPTIOMETRY (DXA) FOR BONE MINERAL DENSITY IMPRESSION: Referring Physician:  [name] Your patient completed a bone mineral density test using GE Lunar iDXA system (analysis version: 16). Technologist: [place] PATIENT: Name: Margaret Best, Margaret Best Patient ID: [id number] Birth Date: 05-27-1960 Height: 63.0 in. Sex: Female Measured: 05/19/2020 Weight: 174.4 lbs. Indications: Arimidex, Breast Cancer History, Caucasian, Estrogen Deficient, Hypothyroid, Levothyroxine, Postmenopausal Fractures: None Treatments: None ASSESSMENT:  The BMD measured at Femur Neck Left is 0.912 g/cm2 with a T-score of -0.9. This patient is considered normal according to [facility]) criteria. The quality of the exam is good. The lumbar spine was excluded due to degenerative changes. Site Region Measured Date Measured Age YA BMD Significant CHANGE T-score DualFemur Neck Left 05/19/2020 60.3 -0.9 0.912 g/cm2 DualFemur Total Mean 05/19/2020 60.3 -0.1 0.997 g/cm2 Left Forearm Radius 33% 05/19/2020 60.3 0.0 0.888 g/cm2 World Health Organization [facility]) criteria for post-menopausal, Caucasian Women: Normal       T-score at or above -1 SD Osteopenia   T-score between -1 and -2.5 SD Osteoporosis T-score at or below -2.5 SD RECOMMENDATION: 1. All patients should optimize calcium and vitamin D intake. 2. Consider FDA-approved medical therapies in postmenopausal women and men aged 61 years and older, based on the following: a. A hip or vertebral (clinical or morphometric) fracture. b. T-score = -2.5 at the femoral neck or spine after appropriate evaluation to exclude secondary causes. c. Low bone mass (T-score between -1.0 and -2.5 at the femoral neck or spine) and a 10-year probability of a hip fracture = 3% or a 10-year probability of a major osteoporosis-related fracture = 20% based on the [place]-adapted WHO algorithm. d. Clinician judgment and/or patient preferences may indicate treatment for people with 10-year fracture probabilities above or below these levels. FOLLOW-UP: Patients with diagnosis of osteoporosis or at high risk for fracture should have regular bone mineral density tests.? Patients eligible for Medicare are allowed routine testing every 2 years.? The testing frequency can be increased to one year for patients who have rapidly progressing disease, are receiving or discontinuing medical therapy to restore bone mass, or have additional risk factors. I have reviewed this study and agree with the findings. [facility]  Electronically Signed   By: Lowella Grip III M.D.   On: 05/19/2020 16:51    Impression/Plan: Healing well from radiotherapy to the breast tissue.  Continue skin care with topical Vitamin E Oil for at least 2 more months for further healing -this should help the dryness and hyperpigmentation.  I encouraged her to continue with yearly mammography as appropriate (for intact breast tissue) and followup with medical oncology. I will see her back on an as-needed basis. I have encouraged her to call if she has any issues or concerns in the future. I wished her the very best.  On date of service, in total, I spent 15 minutes on this encounter. Patient was seen in person.  _____________________________________   [name], MD

## 2020-05-25 ENCOUNTER — Encounter: Payer: BC Managed Care – PPO | Admitting: Physical Therapy

## 2020-05-27 ENCOUNTER — Encounter: Payer: BC Managed Care – PPO | Admitting: Physical Therapy

## 2020-06-01 ENCOUNTER — Ambulatory Visit: Payer: BC Managed Care – PPO | Admitting: Family Medicine

## 2020-06-08 ENCOUNTER — Ambulatory Visit: Payer: BC Managed Care – PPO | Admitting: Internal Medicine

## 2020-06-09 NOTE — Progress Notes (Signed)
  Patient Name: Margaret Best MRN: [id number] DOB: [birth year] Referring Physician: [name] (Profile Not Attached) Date of Service: 04/21/2020 [facility], [place]                                                        End Of Treatment Note  Diagnoses: C50.412-Malignant neoplasm of upper-outer quadrant of left female breast C50.812-Malignant neoplasm of overlapping sites of left female breast  Cancer Staging Cancer Staging Carcinoma of upper-outer quadrant of left breast in female, estrogen receptor positive ([place]) Staging form: Breast, AJCC 8th Edition - Pathologic stage from 03/08/2020: Stage IA (pT1b, pN0, cM0, G1, ER+, PR+, HER2-) - Signed by [name], MD on 03/08/2020 Stage prefix: Initial diagnosis Histologic grading system: 3 grade system   Final pathology revealed a T1bN0 tumor  Intent: Curative  Radiation Treatment Dates: 03/25/2020 through 04/21/2020 Site Technique Total Dose (Gy) Dose per Fx (Gy) Completed Fx Beam Energies  Breast, Left: Breast_Lt 3D 40.05/40.05 2.67 15/15 6X  Breast, Left: Breast_Lt_Bst specialPort 10/10 2 5/5 15E   Narrative: The patient tolerated radiation therapy relatively well.   Plan: The patient will follow-up with radiation oncology in 79mo.  -----------------------------------  [name], MD

## 2020-06-15 ENCOUNTER — Ambulatory Visit: Payer: BC Managed Care – PPO | Admitting: Family Medicine

## 2020-06-22 DIAGNOSIS — M79672 Pain in left foot: Secondary | ICD-10-CM | POA: Diagnosis not present

## 2020-06-24 ENCOUNTER — Ambulatory Visit (INDEPENDENT_AMBULATORY_CARE_PROVIDER_SITE_OTHER): Payer: BC Managed Care – PPO | Admitting: Family Medicine

## 2020-06-24 ENCOUNTER — Encounter: Payer: Self-pay | Admitting: Family Medicine

## 2020-06-24 ENCOUNTER — Other Ambulatory Visit: Payer: Self-pay

## 2020-06-24 DIAGNOSIS — I10 Essential (primary) hypertension: Secondary | ICD-10-CM

## 2020-06-24 MED ORDER — AMLODIPINE BESYLATE 5 MG PO TABS: 5.0000 mg | ORAL_TABLET | Freq: Every day | ORAL | 2 refills | Status: DC

## 2020-06-24 MED ORDER — METOPROLOL TARTRATE 100 MG PO TABS: 100.0000 mg | ORAL_TABLET | Freq: Two times a day (BID) | ORAL | 2 refills | Status: DC

## 2020-06-24 NOTE — Progress Notes (Signed)
Margaret Best - 60 y.o. female MRN [id number]  Date of birth: May 23, 1960  Subjective Chief Complaint  Patient presents with   Hypertension    HPI Margaret Best is a 60 y.o. female here today for follow up of HTN.  Currently taking amlodipine 10mg , hctz 25mg  and metoprolol 100mg  BID.  BP is well controlled but she has noted increased swelling in her ankles since increasing to 10mg  amlodipine.  She reports that blood pressure readings at home were well controlled prior to increase in amlodipine and she would like to try cutting back on this.  She denies symptoms related to HTN including chest pain, shortness of breath,palpitations, headache or vision changes.    ROS:  A comprehensive ROS was completed and negative except as noted per HPI  No Known Allergies  Past Medical History:  Diagnosis Date   Ectopic pregnancy    left   Hormone disorder    HYPOTHYROIDISM   Hypertension    Missed ab    FIRST TRIMESTER   Thyroid disease    Toxemia     Past Surgical History:  Procedure Laterality Date   BREAST LUMPECTOMY Left    CESAREAN SECTION     DILATION AND CURETTAGE OF UTERUS     ENTEROLYSIS  1988   SBO   SALPINGECTOMY  1987   left   TUBAL LIGATION      Social History   Socioeconomic History   Marital status: Divorced    Spouse name: Not on file   Number of children: 2   Years of education: Not on file   Highest education level: Not on file  Occupational History   Not on file  Tobacco Use   Smoking status: Never   Smokeless tobacco: Never  Vaping Use   Vaping Use: Never used  Substance and Sexual Activity   Alcohol use: Yes    Alcohol/week: 5.0 standard drinks    Types: 5 Glasses of wine per week   Drug use: No   Sexual activity: Not Currently    Comment: 1st intercourse 60 yo-More than 5 partners  Other Topics Concern   Not on file  Social History Narrative   Not on file   Social Determinants of Health   Financial Resource Strain: Not on file  Food  Insecurity: Not on file  Transportation Needs: Not on file  Physical Activity: Not on file  Stress: Not on file  Social Connections: Not on file    Family History  Problem Relation Age of Onset   Hypertension Mother    Stroke Mother    Heart failure Mother    Diabetes Father     Health Maintenance  Topic Date Due   Pneumococcal Vaccine 62-34 Years old (1 - PCV) Never done   HIV Screening  Never done   Hepatitis C Screening  Never done   Zoster Vaccines- Shingrix (1 of 2) Never done   COLONOSCOPY (Pts 45-19yrs Insurance coverage will need to be confirmed)  Never done   PAP SMEAR-Modifier  07/08/2018   COVID-19 Vaccine (4 - Booster for Pfizer series) 06/08/2020   INFLUENZA VACCINE  08/02/2020   MAMMOGRAM  01/27/2022   TETANUS/TDAP  01/06/2025   HPV VACCINES  Aged Out     ----------------------------------------------------------------------------------------------------------------------------------------------------------------------------------------------------------------- Physical Exam BP 133/80 (BP Location: Right Arm, Patient Position: Sitting, Cuff Size: Normal)   Pulse 72   Ht 5\' 3"  (1.6 m)   Wt 178 lb (80.7 kg)   LMP 01/20/2012   SpO2 98%  BMI 31.53 kg/m   Physical Exam Constitutional:      Appearance: Normal appearance.  HENT:     Head: Normocephalic and atraumatic.  Cardiovascular:     Rate and Rhythm: Normal rate and regular rhythm.  Pulmonary:     Effort: Pulmonary effort is normal.     Breath sounds: Normal breath sounds.  Neurological:     General: No focal deficit present.     Mental Status: She is alert.  Psychiatric:        Mood and Affect: Mood normal.        Behavior: Behavior normal.    ------------------------------------------------------------------------------------------------------------------------------------------------------------------------------------------------------------------- Assessment and Plan  HTN  (hypertension) BP is well controlled however having bothersome edema with increase in amlodipine.  Will reduce to 5mg  and she will monitor closely at home.  Continue metoprolol and HCTZ at current strength.   Return in about 6 months (around 12/24/2020) for HTN.   Meds ordered this encounter  Medications   metoprolol tartrate (LOPRESSOR) 100 MG tablet    Sig: Take 1 tablet (100 mg total) by mouth 2 (two) times daily.    Dispense:  180 tablet    Refill:  2   amLODipine (NORVASC) 5 MG tablet    Sig: Take 1 tablet (5 mg total) by mouth daily.    Dispense:  90 tablet    Refill:  2    Return in about 6 months (around 12/24/2020) for HTN.    This visit occurred during the SARS-CoV-2 public health emergency.  Safety protocols were in place, including screening questions prior to the visit, additional usage of staff PPE, and extensive cleaning of exam room while observing appropriate contact time as indicated for disinfecting solutions.

## 2020-06-24 NOTE — Patient Instructions (Signed)
You can reduce amlodipine to 5mg . See me again in 6 months.

## 2020-06-24 NOTE — Assessment & Plan Note (Signed)
BP is well controlled however having bothersome edema with increase in amlodipine.  Will reduce to 5mg  and she will monitor closely at home.  Continue metoprolol and HCTZ at current strength.   Return in about 6 months (around 12/24/2020) for HTN.

## 2020-08-13 ENCOUNTER — Telehealth: Payer: Self-pay | Admitting: Nurse Practitioner

## 2020-08-13 NOTE — Telephone Encounter (Signed)
Left message with changed upcoming appointment per 8/11 secure chat.

## 2020-08-17 ENCOUNTER — Ambulatory Visit (HOSPITAL_BASED_OUTPATIENT_CLINIC_OR_DEPARTMENT_OTHER): Payer: BC Managed Care – PPO | Admitting: Nurse Practitioner

## 2020-08-17 ENCOUNTER — Encounter: Payer: Self-pay | Admitting: Nurse Practitioner

## 2020-08-17 DIAGNOSIS — C50412 Malignant neoplasm of upper-outer quadrant of left female breast: Secondary | ICD-10-CM

## 2020-08-17 DIAGNOSIS — Z170 Estrogen receptor positive status [ER+]: Secondary | ICD-10-CM

## 2020-08-17 MED ORDER — ANASTROZOLE 1 MG PO TABS: 1.0000 mg | ORAL_TABLET | Freq: Every day | ORAL | 1 refills | Status: DC

## 2020-08-17 NOTE — Progress Notes (Signed)
CLINIC: Survivorship   Patient Care Team: Margaret Nutting, DO as PCP - General (Family Medicine) Margaret Merle, MD as Consulting Physician (Hematology) Margaret Gibson, MD as Attending Physician (Radiation Oncology) Margaret Feeling, NP as Nurse Practitioner (Nurse Practitioner)   I connected with Margaret Best on 08/17/20 at 11:00 AM EDT by MyChart video  and verified that I am speaking with the correct person using two identifiers.  I discussed the limitations, risks, security and privacy concerns of performing an evaluation and management service by telephone and the availability of in person appointments. I also discussed with the patient that there may be a patient responsible charge related to this service. The patient expressed understanding and agreed to proceed.   BRIEF ONCOLOGIC HISTORY:  Oncology History Overview Note  Cancer Staging Carcinoma of upper-outer quadrant of left breast in female, estrogen receptor positive (Margaret Best) Staging form: Breast, AJCC 8th Edition - Pathologic stage from 03/08/2020: Stage IA (pT1b, pN0, cM0, G1, ER+, PR+, HER2-) - Signed by Margaret Gibson, MD on 03/08/2020 Stage prefix: Initial diagnosis Histologic grading system: 3 grade system    Carcinoma of upper-outer quadrant of left breast in female, estrogen receptor positive (Margaret Best)  12/08/2019 Breast US   Left breast US 12/08/19 Findings:  At the left 12:00 position 7cmfn there is a small mass 1.2x1x0.7cm with spiculated margins and associated calcifications.    01/25/2020 Breast MRI   MRI Breast 01/25/20 Impression Left bresat with lobular enhanceing mass 11:00 position, 6.3cm from nipple, measuring 0.8x1.4x1cm. This is suspicious.  There is linear ductal enhanement extending posteriorly 1.1cm and 2 areas of linear non-mass enhancent extending exteriorly measuring 0.8 and 3.1cm in length. There may represent insitue cancer.    01/28/2020 Initial Biopsy   OUTSIDE SCAN   02/06/2020 Surgery   Left Lumpectomy by  Dr Margaret Best     02/12/2020 Surgery   Left breast re-excision surgery by Dr Margaret Best     03/08/2020 Initial Diagnosis   Carcinoma of upper-outer quadrant of left breast in female, estrogen receptor positive (Margaret Best)   03/08/2020 Cancer Staging   Staging form: Breast, AJCC 8th Edition - Pathologic stage from 03/08/2020: Stage IA (pT1b, pN0, cM0, G1, ER+, PR+, HER2-) - Signed by Margaret Gibson, MD on 03/08/2020 Stage prefix: Initial diagnosis Histologic grading system: 3 grade system   03/25/2020 - 04/21/2020 Radiation Therapy   Adjuvant radiation with Dr Margaret Best    05/2020 -  Anti-estrogen oral therapy   Anastrozole 67m daily starting in 05/2020   08/17/2020 Survivorship   SCP delivered via virtual encounter by LCira Rue NP     INTERVAL HISTORY:  Ms. KMyszkato review her survivorship care plan detailing her treatment course for breast cancer, as well as monitoring long-term side effects of that treatment, education regarding health maintenance, screening, and overall wellness and health promotion.     Overall, Ms. KAdkisonreports Best well overall. She has occasional breast pains and "cramping inside my chest" on both sides. Her skin has recovered well from radiation. Tolerating anastrozole with mild tolerable sweats. She had bone pain in the first 2 weeks on AI then resolved. Mood is good.    ONCOLOGY TREATMENT TEAM:  1. Surgeon:  Dr. BRegan Rakersat outside facility 2. Medical Oncologist: Dr. FBurr Medico 3. Radiation Oncologist: Dr. SIsidore Best   PAST MEDICAL/SURGICAL HISTORY:  Past Medical History:  Diagnosis Date   Ectopic pregnancy    left   Hormone disorder    HYPOTHYROIDISM   Hypertension    Missed ab  FIRST TRIMESTER   Thyroid disease    Toxemia    Past Surgical History:  Procedure Laterality Date   BREAST LUMPECTOMY Left    CESAREAN SECTION     DILATION AND CURETTAGE OF UTERUS     ENTEROLYSIS  1988   SBO   SALPINGECTOMY  1987   left   TUBAL LIGATION        ALLERGIES:  No Known Allergies   CURRENT MEDICATIONS:  Outpatient Encounter Medications as of 08/17/2020  Medication Sig   amLODipine (NORVASC) 5 MG tablet Take 1 tablet (5 mg total) by mouth daily.   anastrozole (ARIMIDEX) 1 MG tablet Take 1 tablet (1 mg total) by mouth daily.   hydrochlorothiazide (HYDRODIURIL) 25 MG tablet Take 1 tablet (25 mg total) by mouth daily.   levothyroxine (SYNTHROID, LEVOTHROID) 75 MCG tablet Take 75 mcg by mouth daily.   LORazepam (ATIVAN) 0.5 MG tablet Take 1 tablet (0.5 mg total) by mouth daily as needed for anxiety. Before radiation as needed   metoprolol tartrate (LOPRESSOR) 100 MG tablet Take 1 tablet (100 mg total) by mouth 2 (two) times daily.   No facility-administered encounter medications on file as of 08/17/2020.     ONCOLOGIC FAMILY HISTORY:  Family History  Problem Relation Age of Onset   Hypertension Mother    Stroke Mother    Heart failure Mother    Diabetes Father      GENETIC COUNSELING/TESTING: No, but interested   SOCIAL HISTORY:  Social History   Socioeconomic History   Marital status: Divorced    Spouse name: Not on file   Number of children: 2   Years of education: Not on file   Highest education level: Not on file  Occupational History   Not on file  Tobacco Use   Smoking status: Never   Smokeless tobacco: Never  Vaping Use   Vaping Use: Never used  Substance and Sexual Activity   Alcohol use: Yes    Alcohol/week: 5.0 standard drinks    Types: 5 Glasses of wine per week   Drug use: No   Sexual activity: Not Currently    Comment: 1st intercourse 60 yo-More than 5 partners  Other Topics Concern   Not on file  Social History Narrative   Not on file   Social Determinants of Health   Financial Resource Strain: Not on file  Food Insecurity: Not on file  Transportation Needs: Not on file  Physical Activity: Not on file  Stress: Not on file  Social Connections: Not on file  Intimate Partner  Violence: Not on file     OBSERVATIONS/OBJECTIVE:  Patient appears well via video, no facial rash or lesions. Strong voice, clear/fluent speech. No cough or conversational dyspnea.   LABORATORY DATA:  None for this visit.  DIAGNOSTIC IMAGING:  None for this visit.      ASSESSMENT AND PLAN:  Margaret Best is a pleasant 60 y.o. female with Stage IA left breast invasive ductal carcinoma, ER+/PR+/HER2-, diagnosed in 01/2020, treated with lumpectomy, adjuvant radiation therapy, and anti-estrogen therapy with anastrozole beginning in 05/2020.  She presents to the Survivorship Clinic for our initial meeting and routine follow-up post-completion of treatment for breast cancer.    1. Stage IA left breast cancer:  Margaret Best has recovered well from definitive treatment for breast cancer. She will follow-up with her medical oncologist, Dr. Burr Medico in 11/2020 with history and physical exam per surveillance protocol.  She will continue her anti-estrogen therapy with anastrozole. Thus far,  she is tolerating it well with mild hot flashes. She was instructed to make Dr. Burr Medico or myself aware if she begins to experience any worsening side effects of the medication and I could see her back in clinic to help manage those side effects, as needed. Her mammogram is due 12/2020; orders placed today. Today, a comprehensive survivorship care plan and treatment summary was reviewed with the patient today detailing her breast cancer diagnosis, treatment course, potential late/long-term effects of treatment, appropriate follow-up care with recommendations for the future, and patient education resources.  A copy of this summary, along with a letter will be sent to the patient's primary care provider via In Basket message after today's visit.    2. Request for genetic referral- I reviewed the characteristics of her breast cancer diagnosis are not suggestive of an inheritable breast cancer pattern. Nonetheless she is requesting  genetics referral, she has a son and daughter and wants to know more about her genetic makeup. She understands insurance may not cover testing and there may be an out-of-pocket cost. I referred her.   3. Bone health:  Given Margaret Best's age/history of breast cancer and her current treatment regimen including anti-estrogen therapy with anastrozole, she is at risk for bone demineralization.  Her last DEXA scan was 05/19/20, which was normal.  In the meantime, she was encouraged to increase her consumption of foods rich in calcium, as well as increase her weight-bearing activities.  She was given education on specific activities to promote bone health.  4.  Cancer screening:  Due to Margaret Best's history and her age, she should receive screening for skin cancers, colon cancer, and gynecologic cancers.  The information and recommendations are listed on the patient's comprehensive care plan/treatment summary and were reviewed in detail with the patient.    5. Health maintenance and wellness promotion: Margaret Best was encouraged to consume 5-7 servings of fruits and vegetables per day. She was also encouraged to engage in moderate to vigorous exercise for 30 minutes per day most days of the week.  She was instructed to limit her alcohol consumption and continue to abstain from tobacco use.   6. Support services/counseling: It is not uncommon for this period of the patient's cancer care trajectory to be one of many emotions and stressors.  We discussed how this can be increasingly difficult during the times of quarantine and social distancing due to the COVID-19 pandemic.   She was given information regarding our available services and encouraged to contact me with any questions or for help enrolling in any of our support group/programs.    Follow up instructions:    -Return to cancer center 11/2020  -Mammogram due in 12/2020 -Follow up with surgery as indicated  -Referral to genetics   Orders Placed This  Encounter  Procedures   MM DIAG BREAST TOMO BILATERAL    Standing Status:   Future    Standing Expiration Date:   08/17/2021    Order Specific Question:   Reason for Exam (SYMPTOM  OR DIAGNOSIS REQUIRED)    Answer:   left breast cancer 01/2020    Order Specific Question:   Is the patient pregnant?    Answer:   No    Order Specific Question:   Preferred imaging location?    Answer:   [facility]   Ambulatory referral to Genetics    Referral Priority:   Routine    Referral Type:   Consultation    Referral Reason:   Specialty  Services Required    Number of Visits Requested:   1     She is welcome to return back to the Survivorship Clinic at any time; no additional follow-up needed at this time. Consider referral back to survivorship as a long-term survivor for continued surveillance.  The patient was provided an opportunity to ask questions and all were answered. The patient agreed with the plan and demonstrated an understanding of the instructions.  The patient was advised to call back or seek an in-person evaluation if the symptoms worsen or if the condition fails to improve as anticipated.  I provided 30 minutes of video-enabled face-to-face time during this encounter.   Margaret Feeling, NP

## 2020-08-20 ENCOUNTER — Telehealth: Payer: Self-pay | Admitting: Nurse Practitioner

## 2020-08-20 NOTE — Telephone Encounter (Signed)
Scheduled follow-up appointment per 8/16 los. Patient is aware. 

## 2020-08-24 DIAGNOSIS — M79672 Pain in left foot: Secondary | ICD-10-CM | POA: Diagnosis not present

## 2020-09-02 ENCOUNTER — Inpatient Hospital Stay: Payer: BC Managed Care – PPO

## 2020-09-02 ENCOUNTER — Other Ambulatory Visit: Payer: Self-pay | Admitting: Genetic Counselor

## 2020-09-02 ENCOUNTER — Other Ambulatory Visit: Payer: Self-pay

## 2020-09-02 ENCOUNTER — Inpatient Hospital Stay: Payer: BC Managed Care – PPO | Attending: Hematology | Admitting: Genetic Counselor

## 2020-09-02 DIAGNOSIS — Z170 Estrogen receptor positive status [ER+]: Secondary | ICD-10-CM | POA: Diagnosis not present

## 2020-09-02 DIAGNOSIS — C50412 Malignant neoplasm of upper-outer quadrant of left female breast: Secondary | ICD-10-CM | POA: Diagnosis not present
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
## 2020-09-14 ENCOUNTER — Other Ambulatory Visit: Payer: Self-pay

## 2020-09-14 DIAGNOSIS — M79672 Pain in left foot: Secondary | ICD-10-CM | POA: Diagnosis not present

## 2020-09-14 DIAGNOSIS — I10 Essential (primary) hypertension: Secondary | ICD-10-CM

## 2020-09-14 MED ORDER — HYDROCHLOROTHIAZIDE 25 MG PO TABS: 25.0000 mg | ORAL_TABLET | Freq: Every day | ORAL | 0 refills | Status: DC

## 2020-10-12 DIAGNOSIS — M79672 Pain in left foot: Secondary | ICD-10-CM | POA: Diagnosis not present

## 2020-10-25 ENCOUNTER — Ambulatory Visit: Payer: BC Managed Care – PPO | Admitting: Obstetrics and Gynecology

## 2020-11-08 ENCOUNTER — Inpatient Hospital Stay: Payer: BC Managed Care – PPO | Attending: Hematology | Admitting: Hematology

## 2020-11-08 ENCOUNTER — Inpatient Hospital Stay: Payer: BC Managed Care – PPO

## 2020-11-08 ENCOUNTER — Encounter: Payer: Self-pay | Admitting: Hematology

## 2020-11-08 ENCOUNTER — Other Ambulatory Visit: Payer: Self-pay

## 2020-11-08 VITALS — BP 146/81 | HR 82 | Temp 97.7°F | Resp 17 | Ht 63.0 in | Wt 183.4 lb

## 2020-11-08 DIAGNOSIS — Z170 Estrogen receptor positive status [ER+]: Secondary | ICD-10-CM | POA: Diagnosis not present

## 2020-11-08 DIAGNOSIS — E039 Hypothyroidism, unspecified: Secondary | ICD-10-CM | POA: Insufficient documentation

## 2020-11-08 DIAGNOSIS — Z923 Personal history of irradiation: Secondary | ICD-10-CM | POA: Insufficient documentation

## 2020-11-08 DIAGNOSIS — Z79811 Long term (current) use of aromatase inhibitors: Secondary | ICD-10-CM | POA: Diagnosis not present

## 2020-11-08 DIAGNOSIS — C50412 Malignant neoplasm of upper-outer quadrant of left female breast: Secondary | ICD-10-CM | POA: Insufficient documentation

## 2020-11-08 DIAGNOSIS — I10 Essential (primary) hypertension: Secondary | ICD-10-CM | POA: Diagnosis not present

## 2020-11-08 LAB — CBC WITH DIFFERENTIAL (CANCER CENTER ONLY)
Abs Immature Granulocytes: 0.01 10*3/uL (ref 0.00–0.07)
Basophils Absolute: 0 10*3/uL (ref 0.0–0.1)
Basophils Relative: 1 %
Eosinophils Absolute: 0 10*3/uL (ref 0.0–0.5)
Eosinophils Relative: 1 %
HCT: 42.9 % (ref 36.0–46.0)
Hemoglobin: 14.7 g/dL (ref 12.0–15.0)
Immature Granulocytes: 0 %
Lymphocytes Relative: 25 %
Lymphs Abs: 1.9 10*3/uL (ref 0.7–4.0)
MCH: 32 pg (ref 26.0–34.0)
MCHC: 34.3 g/dL (ref 30.0–36.0)
MCV: 93.3 fL (ref 80.0–100.0)
Monocytes Absolute: 0.6 10*3/uL (ref 0.1–1.0)
Monocytes Relative: 8 %
Neutro Abs: 4.8 10*3/uL (ref 1.7–7.7)
Neutrophils Relative %: 65 %
Platelet Count: 243 10*3/uL (ref 150–400)
RBC: 4.6 MIL/uL (ref 3.87–5.11)
RDW: 12.1 % (ref 11.5–15.5)
WBC Count: 7.4 10*3/uL (ref 4.0–10.5)
nRBC: 0 % (ref 0.0–0.2)

## 2020-11-08 LAB — CMP (CANCER CENTER ONLY)
ALT: 49 U/L — ABNORMAL HIGH (ref 0–44)
AST: 36 U/L (ref 15–41)
Albumin: 4 g/dL (ref 3.5–5.0)
Alkaline Phosphatase: 64 U/L (ref 38–126)
Anion gap: 10 (ref 5–15)
BUN: 15 mg/dL (ref 6–20)
CO2: 26 mmol/L (ref 22–32)
Calcium: 9.9 mg/dL (ref 8.9–10.3)
Chloride: 103 mmol/L (ref 98–111)
Creatinine: 0.92 mg/dL (ref 0.44–1.00)
GFR, Estimated: >60 mL/min (ref >60)
Glucose, Bld: 129 mg/dL — ABNORMAL HIGH (ref 70–99)
Potassium: 4.6 mmol/L (ref 3.5–5.1)
Sodium: 139 mmol/L (ref 135–145)
Total Bilirubin: 0.6 mg/dL (ref 0.3–1.2)
Total Protein: 7.6 g/dL (ref 6.5–8.1)

## 2020-11-08 NOTE — Progress Notes (Signed)
Margaret Best   [phone] Fax:([phone]   Clinic Follow up Note   Patient Care Team: Margaret Nutting, DO as PCP - General (Family Medicine) Margaret Merle, MD as Consulting Physician (Hematology) Margaret Gibson, MD as Attending Physician (Radiation Oncology) Margaret Feeling, NP as Nurse Practitioner (Nurse Practitioner)  Date of Service:  11/08/2020  CHIEF COMPLAINT: f/u of left breast cancer  CURRENT THERAPY:  Anastrozole $RemoveBefo'1mg'MybRdZaypra$  daily starting in 05/2020  ASSESSMENT & PLAN:  Margaret Best is a 60 y.o. female with   1. Left breast cancer, Stage IA, p(T1bN0M0), ER+/PR+/HER2-, Grade I  -She was diagnosed in 01/2020. She underwent left lumpectomy by Dr Margaret Best in [place] on 02/06/20 and re-excision on 02/12/20 for close margin and hematoma evacuation. Surgical path showed 0.9 mm invasive ductal carcinoma, grade 1, post ER and PR positive, HER-2 negative, with negative margins and negative lymph nodes. She overall has low risk of recurrence, oncotype not recommended.  -She received adjuvant radiation with Dr Margaret Best 03/25/20-04/21/20 -She began anastrozole in 05/2020. She is tolerating well with mild hot flashes and weight gain. -She is clinically doing well. Labs reviewed, CBC and CMP WNL. Physical exam benign with mild breast changes s/p surgery and radiation. There is no clinical concern for recurrence.  -Continue surveillance. Next mammogram due this month, I wrote a prescription for her to take to [facility]. An order is also in place for [facility]. She will decide where she wants to go. -lab and f/u with NP Margaret Best in 4 months   2. HTN and hypothyroidism -Continue medication, follow-up with PCP. -Her BP is elevated at 164/104 today (05/03/20). She has not taken her medications yet this morning. She notes her home BP range is 140/80. I encouraged her to continue to monitor.  -She is interested in losing weight. I recommend change in diet and increased exercise. To  help manage her diet, I will refer to dietician. She is interested in this.    3. Bone health  -baseline DEXA on 05/19/20 was normal (T-score of -0.9) -We discussed the negative impact on her bone density from aromatase inhibitor. -I encouraged her to start calcium and vitamin D supplement.     PLAN:  -continue anastrozole -mammogram in near future at [facility] (or [facility])  -lab and f/u with NP Margaret Best in 4 months   No problem-specific Assessment & Plan notes found for this encounter.   SUMMARY OF ONCOLOGIC HISTORY: Oncology History Overview Note  Cancer Staging Carcinoma of upper-outer quadrant of left breast in female, estrogen receptor positive (Margaret Best) Staging form: Breast, AJCC 8th Edition - Pathologic stage from 03/08/2020: Stage IA (pT1b, pN0, cM0, G1, ER+, PR+, HER2-) - Signed by Margaret Gibson, MD on 03/08/2020 Stage prefix: Initial diagnosis Histologic grading system: 3 grade system    Carcinoma of upper-outer quadrant of left breast in female, estrogen receptor positive (Margaret Best)  12/08/2019 Breast US   Left breast US 12/08/19 Findings:  At the left 12:00 position 7cmfn there is a small mass 1.2x1x0.7cm with spiculated margins and associated calcifications.    01/25/2020 Breast MRI   MRI Breast 01/25/20 Impression Left bresat with lobular enhanceing mass 11:00 position, 6.3cm from nipple, measuring 0.8x1.4x1cm. This is suspicious.  There is linear ductal enhanement extending posteriorly 1.1cm and 2 areas of linear non-mass enhancent extending exteriorly measuring 0.8 and 3.1cm in length. There may represent insitue cancer.    01/28/2020 Initial Biopsy   OUTSIDE SCAN   02/06/2020 Surgery   Left Lumpectomy by Dr  Margaret Best     02/12/2020 Surgery   Left breast re-excision surgery by Dr Margaret Best     03/08/2020 Initial Diagnosis   Carcinoma of upper-outer quadrant of left breast in female, estrogen receptor positive (Margaret Best)   03/08/2020 Cancer Staging   Staging form: Breast, AJCC 8th  Edition - Pathologic stage from 03/08/2020: Stage IA (pT1b, pN0, cM0, G1, ER+, PR+, HER2-) - Signed by Margaret Gibson, MD on 03/08/2020 Stage prefix: Initial diagnosis Histologic grading system: 3 grade system    03/25/2020 - 04/21/2020 Radiation Therapy   Adjuvant radiation with Dr Margaret Best    05/2020 -  Anti-estrogen oral therapy   Anastrozole 26m daily starting in 05/2020   08/17/2020 Survivorship   SCP delivered via virtual encounter by LCira Rue NP      INTERVAL HISTORY:  FMyisha Pickerelis here for a follow up of breast cancer. She was last seen by me on 05/03/20 and in the survivorship clinic in the interim. She presents to the clinic alone. She reports she has residual pain to her incision site, rated 3-4/10. She notes it's more of a constant pain than shooting.  She reports hot flashes from anastrozole, which are bearable.   All other systems were reviewed with the patient and are negative.  MEDICAL HISTORY:  Past Medical History:  Diagnosis Date   Ectopic pregnancy    left   Hormone disorder    HYPOTHYROIDISM   Hypertension    Missed ab    FIRST TRIMESTER   Thyroid disease    Toxemia     SURGICAL HISTORY: Past Surgical History:  Procedure Laterality Date   BREAST LUMPECTOMY Left    CESAREAN SECTION     DILATION AND CURETTAGE OF UTERUS     ENTEROLYSIS  1988   SBO   SALPINGECTOMY  1987   left   TUBAL LIGATION      I have reviewed the social history and family history with the patient and they are unchanged from previous note.  ALLERGIES:  has No Known Allergies.  MEDICATIONS:  Current Outpatient Medications  Medication Sig Dispense Refill   amLODipine (NORVASC) 5 MG tablet Take 1 tablet (5 mg total) by mouth daily. 90 tablet 2   anastrozole (ARIMIDEX) 1 MG tablet Take 1 tablet (1 mg total) by mouth daily. 90 tablet 1   hydrochlorothiazide (HYDRODIURIL) 25 MG tablet Take 1 tablet (25 mg total) by mouth daily. 90 tablet 0   levothyroxine (SYNTHROID,  LEVOTHROID) 75 MCG tablet Take 75 mcg by mouth daily.     LORazepam (ATIVAN) 0.5 MG tablet Take 1 tablet (0.5 mg total) by mouth daily as needed for anxiety. Before radiation as needed 15 tablet 0   metoprolol tartrate (LOPRESSOR) 100 MG tablet Take 1 tablet (100 mg total) by mouth 2 (two) times daily. 180 tablet 2   No current facility-administered medications for this visit.    PHYSICAL EXAMINATION: ECOG PERFORMANCE STATUS: 0 - Asymptomatic  Vitals:   11/08/20 0937  BP: (!) 146/81  Pulse: 82  Resp: 17  Temp: 97.7 F (36.5 C)  SpO2: 99%   Wt Readings from Last 3 Encounters:  11/08/20 183 lb 6.4 oz (83.2 kg)  06/24/20 178 lb (80.7 kg)  05/21/20 174 lb 6 oz (79.1 kg)     GENERAL:alert, no distress and comfortable SKIN: skin color, texture, turgor are normal, no rashes or significant lesions EYES: normal, Conjunctiva are pink and non-injected, sclera clear  NECK: supple, thyroid normal size, non-tender, without nodularity LYMPH:  no palpable lymphadenopathy in the cervical, axillary  LUNGS: clear to auscultation and percussion with normal breathing effort HEART: regular rate & rhythm and no murmurs and no lower extremity edema ABDOMEN:abdomen soft, non-tender and normal bowel sounds Musculoskeletal:no cyanosis of digits and no clubbing  NEURO: alert & oriented x 3 with fluent speech, no focal motor/sensory deficits BREAST: No palpable mass, nodules or adenopathy bilaterally. Breast exam benign.   LABORATORY DATA:  I have reviewed the data as listed CBC Latest Ref Rng & Units 11/08/2020 05/03/2020 12/18/2017  WBC 4.0 - 10.5 K/uL 7.4 6.9 13.0(H)  Hemoglobin 12.0 - 15.0 g/dL 14.7 14.5 13.1  Hematocrit 36.0 - 46.0 % 42.9 43.8 40.5  Platelets 150 - 400 K/uL 243 209 241     CMP Latest Ref Rng & Units 11/08/2020 05/03/2020 04/20/2020  Glucose 70 - 99 mg/dL 129(H) 116(H) 97  BUN 6 - 20 mg/dL _0 Creatinine 0.44 - 1.00 mg/dL 0.92 1.07(H) 0.85  Sodium 135 - 145 mmol/L 139 142  140  Potassium 3.5 - 5.1 mmol/L 4.6 4.4 4.8  Chloride 98 - 111 mmol/L 103 102 101  CO2 22 - 32 mmol/L _1 Calcium 8.9 - 10.3 mg/dL 9.9 10.1 10.4  Total Protein 6.5 - 8.1 g/dL 7.6 7.7 -  Total Bilirubin 0.3 - 1.2 mg/dL 0.6 0.5 -  Alkaline Phos 38 - 126 U/L 64 57 -  AST 15 - 41 U/L 36 26 -  ALT 0 - 44 U/L 49(H) 24 -      RADIOGRAPHIC STUDIES: I have personally reviewed the radiological images as listed and agreed with the findings in the report. No results found.    No orders of the defined types were placed in this encounter.  All questions were answered. The patient knows to call the clinic with any problems, questions or concerns. No barriers to learning was detected. The total time spent in the appointment was 30 minutes.     Margaret Merle, MD 11/08/2020   I, Wilburn Mylar, am acting as scribe for Margaret Merle, MD.   I have reviewed the above documentation for accuracy and completeness, and I agree with the above.

## 2020-12-06 ENCOUNTER — Ambulatory Visit (INDEPENDENT_AMBULATORY_CARE_PROVIDER_SITE_OTHER): Payer: BC Managed Care – PPO | Admitting: Family Medicine

## 2020-12-06 ENCOUNTER — Other Ambulatory Visit: Payer: Self-pay

## 2020-12-06 ENCOUNTER — Encounter: Payer: Self-pay | Admitting: Family Medicine

## 2020-12-06 DIAGNOSIS — I10 Essential (primary) hypertension: Secondary | ICD-10-CM | POA: Diagnosis not present

## 2020-12-06 MED ORDER — METOPROLOL TARTRATE 100 MG PO TABS: 100.0000 mg | ORAL_TABLET | Freq: Two times a day (BID) | ORAL | 2 refills | Status: DC

## 2020-12-06 MED ORDER — AMLODIPINE BESYLATE 5 MG PO TABS: 5.0000 mg | ORAL_TABLET | Freq: Every day | ORAL | 2 refills | Status: DC

## 2020-12-06 MED ORDER — HYDROCHLOROTHIAZIDE 25 MG PO TABS: 25.0000 mg | ORAL_TABLET | Freq: Every day | ORAL | 2 refills | Status: DC

## 2020-12-06 NOTE — Progress Notes (Signed)
Margaret Best - 60 y.o. female MRN [id number]  Date of birth: 04/07/1960  Subjective Chief Complaint  Patient presents with   Hypertension    HPI Margaret Best is a 60 year old female here today for follow-up of hypertension.  Blood pressure is elevated today.  She denies any symptoms related to this including chest pain, shortness of breath, headaches or vision changes.  She is taking medications as directed.  Her blood pressure readings tend to be elevated at her doctor's appointments.  Her readings at home have been better controlled.  ROS:  A comprehensive ROS was completed and negative except as noted per HPI  No Known Allergies  Past Medical History:  Diagnosis Date   Ectopic pregnancy    left   Hormone disorder    HYPOTHYROIDISM   Hypertension    Missed ab    FIRST TRIMESTER   Thyroid disease    Toxemia     Past Surgical History:  Procedure Laterality Date   BREAST LUMPECTOMY Left    CESAREAN SECTION     DILATION AND CURETTAGE OF UTERUS     ENTEROLYSIS  1988   SBO   SALPINGECTOMY  1987   left   TUBAL LIGATION      Social History   Socioeconomic History   Marital status: Divorced    Spouse name: Not on file   Number of children: 2   Years of education: Not on file   Highest education level: Not on file  Occupational History   Not on file  Tobacco Use   Smoking status: Never   Smokeless tobacco: Never  Vaping Use   Vaping Use: Never used  Substance and Sexual Activity   Alcohol use: Yes    Alcohol/week: 5.0 standard drinks    Types: 5 Glasses of wine per week   Drug use: No   Sexual activity: Not Currently    Comment: 1st intercourse 60 yo-More than 5 partners  Other Topics Concern   Not on file  Social History Narrative   Not on file   Social Determinants of Health   Financial Resource Strain: Not on file  Food Insecurity: Not on file  Transportation Needs: Not on file  Physical Activity: Not on file  Stress: Not on file  Social  Connections: Not on file    Family History  Problem Relation Age of Onset   Hypertension Mother    Stroke Mother    Heart failure Mother    Diabetes Father     Health Maintenance  Topic Date Due   Pneumococcal Vaccine 88-29 Years old (1 - PCV) Never done   HIV Screening  Never done   Hepatitis C Screening  Never done   Zoster Vaccines- Shingrix (1 of 2) Never done   COLONOSCOPY (Pts 45-70yrs Insurance coverage will need to be confirmed)  Never done   PAP SMEAR-Modifier  07/08/2018   COVID-19 Vaccine (4 - Booster for [place] series) 05/03/2020   INFLUENZA VACCINE  08/02/2020   MAMMOGRAM  01/27/2022   TETANUS/TDAP  01/06/2025   HPV VACCINES  Aged Out     ----------------------------------------------------------------------------------------------------------------------------------------------------------------------------------------------------------------- Physical Exam BP (!) 160/96 (BP Location: Right Arm, Patient Position: Sitting, Cuff Size: Normal)   Pulse 73   Temp (!) 97.1 F (36.2 C)   Ht 5\' 3"  (1.6 m)   Wt 186 lb (84.4 kg)   LMP 01/20/2012   SpO2 98%   BMI 32.95 kg/m   Physical Exam Constitutional:      Appearance: Normal appearance.  Eyes:     General: No scleral icterus. Cardiovascular:     Rate and Rhythm: Normal rate and regular rhythm.  Pulmonary:     Effort: Pulmonary effort is normal.     Breath sounds: Normal breath sounds.  Musculoskeletal:     Cervical back: Neck supple.  Neurological:     Mental Status: She is alert.  Psychiatric:        Mood and Affect: Mood normal.        Behavior: Behavior normal.    ------------------------------------------------------------------------------------------------------------------------------------------------------------------------------------------------------------------- Assessment and Plan  HTN (hypertension) Blood pressure is elevated today.  Strongly suspect there is a component of  whitecoat hypertension.  She will monitor readings at home and send me a copy of these readings over the next couple of weeks.  Continue current medications.   Meds ordered this encounter  Medications   amLODipine (NORVASC) 5 MG tablet    Sig: Take 1 tablet (5 mg total) by mouth daily.    Dispense:  90 tablet    Refill:  2   hydrochlorothiazide (HYDRODIURIL) 25 MG tablet    Sig: Take 1 tablet (25 mg total) by mouth daily.    Dispense:  90 tablet    Refill:  2   metoprolol tartrate (LOPRESSOR) 100 MG tablet    Sig: Take 1 tablet (100 mg total) by mouth 2 (two) times daily.    Dispense:  180 tablet    Refill:  2    Return in about 6 months (around 06/06/2021) for HTN.    This visit occurred during the SARS-CoV-2 public health emergency.  Safety protocols were in place, including screening questions prior to the visit, additional usage of staff PPE, and extensive cleaning of exam room while observing appropriate contact time as indicated for disinfecting solutions.

## 2020-12-06 NOTE — Assessment & Plan Note (Signed)
Blood pressure is elevated today.  Strongly suspect there is a component of whitecoat hypertension.  She will monitor readings at home and send me a copy of these readings over the next couple of weeks.  Continue current medications.

## 2020-12-21 ENCOUNTER — Ambulatory Visit: Payer: BC Managed Care – PPO | Admitting: Family Medicine

## 2021-01-03 ENCOUNTER — Other Ambulatory Visit: Payer: Self-pay | Admitting: Nurse Practitioner

## 2021-01-18 DIAGNOSIS — M79672 Pain in left foot: Secondary | ICD-10-CM | POA: Diagnosis not present

## 2021-02-15 DIAGNOSIS — M79672 Pain in left foot: Secondary | ICD-10-CM | POA: Diagnosis not present

## 2021-03-06 NOTE — Progress Notes (Signed)
[name]   Telephone:(336) [phone] Fax:(336) [phone]   Clinic Follow up Note   Patient Care Team: [name], DO as PCP - General (Family Medicine) [name], MD as Consulting Physician (Hematology) [name], MD as Attending Physician (Radiation Oncology) [name], NP as Nurse Practitioner (Nurse Practitioner) 03/07/2021  CHIEF COMPLAINT: Follow up left breast cancer   SUMMARY OF ONCOLOGIC HISTORY: Oncology History Overview Note  Cancer Staging Carcinoma of upper-outer quadrant of left breast in female, estrogen receptor positive ([place]) Staging form: Breast, AJCC 8th Edition - Pathologic stage from 03/08/2020: Stage IA (pT1b, pN0, cM0, G1, ER+, PR+, HER2-) - Signed by [name], MD on 03/08/2020 Stage prefix: Initial diagnosis Histologic grading system: 3 grade system    Carcinoma of upper-outer quadrant of left breast in female, estrogen receptor positive ([place])  12/08/2019 Breast US   Left breast US 12/08/19 Findings:  At the left 12:00 position 7cmfn there is a small mass 1.2x1x0.7cm with spiculated margins and associated calcifications.    01/25/2020 Breast MRI   MRI Breast 01/25/20 Impression Left bresat with lobular enhanceing mass 11:00 position, 6.3cm from nipple, measuring 0.8x1.4x1cm. This is suspicious.  There is linear ductal enhanement extending posteriorly 1.1cm and 2 areas of linear non-mass enhancent extending exteriorly measuring 0.8 and 3.1cm in length. There may represent insitue cancer.    01/28/2020 Initial Biopsy   OUTSIDE SCAN   02/06/2020 Surgery   Left Lumpectomy by Dr Regan Rakers     02/12/2020 Surgery   Left breast re-excision surgery by Dr Regan Rakers     03/08/2020 Initial Diagnosis   Carcinoma of upper-outer quadrant of left breast in female, estrogen receptor positive ([place])   03/08/2020 Cancer Staging   Staging form: Breast, AJCC 8th Edition - Pathologic stage from 03/08/2020: Stage IA (pT1b, pN0, cM0, G1, ER+, PR+,  HER2-) - Signed by [name], MD on 03/08/2020 Stage prefix: Initial diagnosis Histologic grading system: 3 grade system    03/25/2020 - 04/21/2020 Radiation Therapy   Adjuvant radiation with Dr Isidore Moos    05/2020 -  Anti-estrogen oral therapy   Anastrozole 80m daily starting in 05/2020   08/17/2020 Survivorship   SCP delivered via virtual encounter by LCira Rue NP     CURRENT THERAPY: Anastrozole 1 mg daily starting 05/2020  INTERVAL HISTORY: Margaret Best for follow up as scheduled. Last seen by Dr. FBurr Medico11/7/22. She continues Anastrozole, tolerating well except mild hot flashes.  She denies bone or joint pain.  She has occasional bilateral breast pain, more on the left.  She describes this as a cramping sensation especially if she moves her arm a certain way or lies on her left side.  She has a certain exercise to relieve this where she bends over and swings her arms low.  Denies new lump/mass, nipple discharge or inversion, or skin change.  [facility] over[facility] for [facility] because she has previous reports but not the actual images.  She would like to proceed with scheduling at breast center.  He has a chronic cough, worse after eating, she attributes to reflux.  She thinks she is dehydrated today, she feels thirsty. She continues vitamin D and takes vitamin K. She thinks she has fatty liver.  All other systems were reviewed with the patient and are negative.  MEDICAL HISTORY:  Past Medical History:  Diagnosis Date   Ectopic pregnancy    left   Hormone disorder    HYPOTHYROIDISM  Hypertension    Missed ab    FIRST TRIMESTER   Thyroid disease    Toxemia     SURGICAL HISTORY: Past Surgical History:  Procedure Laterality Date   BREAST LUMPECTOMY Left    CESAREAN SECTION     DILATION AND CURETTAGE OF UTERUS     ENTEROLYSIS  1988   SBO   SALPINGECTOMY  1987   left   TUBAL LIGATION      I have reviewed the social  history and family history with the patient and they are unchanged from previous note.  ALLERGIES:  has No Known Allergies.  MEDICATIONS:  Current Outpatient Medications  Medication Sig Dispense Refill   amLODipine (NORVASC) 5 MG tablet Take 1 tablet (5 mg total) by mouth daily. 90 tablet 2   anastrozole (ARIMIDEX) 1 MG tablet TAKE 1 TABLET(1 MG) BY MOUTH DAILY 90 tablet 1   hydrochlorothiazide (HYDRODIURIL) 25 MG tablet Take 1 tablet (25 mg total) by mouth daily. 90 tablet 2   levothyroxine (SYNTHROID, LEVOTHROID) 75 MCG tablet Take 75 mcg by mouth daily.     LORazepam (ATIVAN) 0.5 MG tablet Take 1 tablet (0.5 mg total) by mouth daily as needed for anxiety. Before radiation as needed 15 tablet 0   metoprolol tartrate (LOPRESSOR) 100 MG tablet Take 1 tablet (100 mg total) by mouth 2 (two) times daily. 180 tablet 2   No current facility-administered medications for this visit.    PHYSICAL EXAMINATION: ECOG PERFORMANCE STATUS: 0 - Asymptomatic  Vitals:   03/07/21 0938  BP: (!) 184/99  Pulse: 88  Resp: 17  Temp: 97.9 F (36.6 C)  SpO2: 99%   Filed Weights   03/07/21 0938  Weight: 183 lb (83 kg)    GENERAL:alert, no distress and comfortable SKIN: No rash EYES: sclera clear NECK: Without mass LYMPH:  no palpable cervical or supraclavicular lymphadenopathy LUNGS: clear with normal breathing effort HEART: regular rate & rhythm, no lower extremity edema ABDOMEN:abdomen soft, non-tender and normal bowel sounds Musculoskeletal: Nonfocal NEURO: alert & oriented x 3 with fluent speech, no focal motor deficits Breast exam: Breasts are symmetrical without nipple discharge or inversion.  S/p left lumpectomy, incisions completely healed.  Mild breast hyperpigmentation.  No palpable mass or nodularity in either breast or axilla that I could appreciate.  LABORATORY DATA:  I have reviewed the data as listed CBC Latest Ref Rng & Units 03/07/2021 11/08/2020 05/03/2020  WBC 4.0 - 10.5 K/uL 6.5  7.4 6.9  Hemoglobin 12.0 - 15.0 g/dL 15.2(H) 14.7 14.5  Hematocrit 36.0 - 46.0 % 45.3 42.9 43.8  Platelets 150 - 400 K/uL 230 243 209     CMP Latest Ref Rng & Units 03/07/2021 11/08/2020 05/03/2020  Glucose 70 - 99 mg/dL 133(H) 129(H) 116(H)  BUN 8 - 23 mg/dL '17 15 16  ' Creatinine 0.44 - 1.00 mg/dL 1.00 0.92 1.07(H)  Sodium 135 - 145 mmol/L 138 139 142  Potassium 3.5 - 5.1 mmol/L 4.8 4.6 4.4  Chloride 98 - 111 mmol/L 103 103 102  CO2 22 - 32 mmol/L '27 26 28  ' Calcium 8.9 - 10.3 mg/dL 10.6(H) 9.9 10.1  Total Protein 6.5 - 8.1 g/dL 8.0 7.6 7.7  Total Bilirubin 0.3 - 1.2 mg/dL 0.6 0.6 0.5  Alkaline Phos 38 - 126 U/L 60 64 57  AST 15 - 41 U/L 45(H) 36 26  ALT 0 - 44 U/L 51(H) 49(H) 24      RADIOGRAPHIC STUDIES: I have personally reviewed the radiological images as listed  and agreed with the findings in the report. No results found.   ASSESSMENT & PLAN: Margaret Best is a 61 y.o. female with    1. Left breast cancer, Stage IA, p(T1bN0M0), ER+/PR+/HER2-, Grade I  -Diagnosed in 01/2020. S/p left lumpectomy by Dr Johny Blamer in [place] on 02/06/20 and re-excision on 02/12/20 for close margin and hematoma evacuation. Surgical path showed 0.9 mm invasive ductal carcinoma, grade 1, post ER and PR positive, HER-2 negative, with negative margins and negative lymph nodes. She overall has low risk of recurrence, oncotype not recommended.  -She received adjuvant radiation with Dr Isidore Moos 03/25/20-04/21/20 -She began anastrozole in 05/2020, tolerating well with mild hot flashes/night sweats. -last mammogram 01/2020 is benign, she is overdue due to records/scheduling issues and is now scheduled on 03/25/21 -Mrs. Till is clinically doing well.  Tolerating anastrozole with mild hot flashes/night sweats.  Breast exam is benign.  Labs reviewed.  Overall no clinical concern for breast cancer recurrence.  -She is over 1 year from diagnosis, continue surveillance and anastrozole -F/up in 4 months    2. Elevated  AST/ALT -ALT of 49 on 11/08/20, labs today show ALT 51 and AST 45, alk phos and T. bili normal -Exam is benign -Patient thinks she may have fatty liver -I recommend to repeat lab in 1 month, if she has persistent or worsened transaminitis will obtain liver ultrasound   3. Bone health  -baseline DEXA on 05/19/20 was normal (T-score of -0.9) -she understands AIs can lower bone density -she takes vitamin D supplement. I encouraged her to increase weight bearing exercise such as walking.  -due to elevated Calcium 10.6 today (10.1 - 10.4 in 2022), I recommend to avoid calcium supplement. I encouraged her to hydrate and repeat CMP in 4 weeks  4. HTN, hypothyroidism -continue med regimen, f/up PCP   Plan: -Labs reviewed Hgb 15.2, Ca 10.6, AST 45, ALT 51 -She does not take calcium supplement.  -Hydrate and repeat labs in 4 weeks, I will call her with results and recommendations -Proceed with mammogram 03/25/2021, my nurse scheduled for her today -If repeat labs are normal, surveillance f/up in 4 months, so sooner if needed  All questions were answered. The patient knows to call the clinic with any problems, questions or concerns. No barriers to learning was detected. I spent 20 minutes counseling the patient face to face. The total time spent in the appointment was 30 minutes and more than 50% was on counseling and review of test results     [name], NP 03/07/21

## 2021-03-07 ENCOUNTER — Inpatient Hospital Stay: Payer: BC Managed Care – PPO

## 2021-03-07 ENCOUNTER — Inpatient Hospital Stay: Payer: BC Managed Care – PPO | Attending: Nurse Practitioner | Admitting: Nurse Practitioner

## 2021-03-07 ENCOUNTER — Other Ambulatory Visit: Payer: Self-pay

## 2021-03-07 ENCOUNTER — Encounter: Payer: Self-pay | Admitting: Nurse Practitioner

## 2021-03-07 VITALS — BP 184/99 | HR 88 | Temp 97.9°F | Resp 17 | Wt 183.0 lb

## 2021-03-07 DIAGNOSIS — Z170 Estrogen receptor positive status [ER+]: Secondary | ICD-10-CM | POA: Diagnosis not present

## 2021-03-07 DIAGNOSIS — C50412 Malignant neoplasm of upper-outer quadrant of left female breast: Secondary | ICD-10-CM | POA: Diagnosis not present

## 2021-03-07 DIAGNOSIS — Z79811 Long term (current) use of aromatase inhibitors: Secondary | ICD-10-CM | POA: Diagnosis not present

## 2021-03-07 DIAGNOSIS — Z79899 Other long term (current) drug therapy: Secondary | ICD-10-CM | POA: Insufficient documentation

## 2021-03-07 LAB — CBC WITH DIFFERENTIAL (CANCER CENTER ONLY)
Abs Immature Granulocytes: 0.02 10*3/uL (ref 0.00–0.07)
Basophils Absolute: 0 10*3/uL (ref 0.0–0.1)
Basophils Relative: 1 %
Eosinophils Absolute: 0.1 10*3/uL (ref 0.0–0.5)
Eosinophils Relative: 1 %
HCT: 45.3 % (ref 36.0–46.0)
Hemoglobin: 15.2 g/dL — ABNORMAL HIGH (ref 12.0–15.0)
Immature Granulocytes: 0 %
Lymphocytes Relative: 30 %
Lymphs Abs: 2 10*3/uL (ref 0.7–4.0)
MCH: 31.7 pg (ref 26.0–34.0)
MCHC: 33.6 g/dL (ref 30.0–36.0)
MCV: 94.4 fL (ref 80.0–100.0)
Monocytes Absolute: 0.6 10*3/uL (ref 0.1–1.0)
Monocytes Relative: 10 %
Neutro Abs: 3.8 10*3/uL (ref 1.7–7.7)
Neutrophils Relative %: 58 %
Platelet Count: 230 10*3/uL (ref 150–400)
RBC: 4.8 MIL/uL (ref 3.87–5.11)
RDW: 12.2 % (ref 11.5–15.5)
WBC Count: 6.5 10*3/uL (ref 4.0–10.5)
nRBC: 0 % (ref 0.0–0.2)

## 2021-03-07 LAB — CMP (CANCER CENTER ONLY)
ALT: 51 U/L — ABNORMAL HIGH (ref 0–44)
AST: 45 U/L — ABNORMAL HIGH (ref 15–41)
Albumin: 4.5 g/dL (ref 3.5–5.0)
Alkaline Phosphatase: 60 U/L (ref 38–126)
Anion gap: 8 (ref 5–15)
BUN: 17 mg/dL (ref 8–23)
CO2: 27 mmol/L (ref 22–32)
Calcium: 10.6 mg/dL — ABNORMAL HIGH (ref 8.9–10.3)
Chloride: 103 mmol/L (ref 98–111)
Creatinine: 1 mg/dL (ref 0.44–1.00)
GFR, Estimated: >60 mL/min (ref >60)
Glucose, Bld: 133 mg/dL — ABNORMAL HIGH (ref 70–99)
Potassium: 4.8 mmol/L (ref 3.5–5.1)
Sodium: 138 mmol/L (ref 135–145)
Total Bilirubin: 0.6 mg/dL (ref 0.3–1.2)
Total Protein: 8 g/dL (ref 6.5–8.1)

## 2021-03-15 DIAGNOSIS — M79672 Pain in left foot: Secondary | ICD-10-CM | POA: Diagnosis not present

## 2021-04-04 ENCOUNTER — Inpatient Hospital Stay: Payer: BC Managed Care – PPO

## 2021-04-06 ENCOUNTER — Ambulatory Visit
Admission: RE | Admit: 2021-04-06 | Discharge: 2021-04-06 | Disposition: A | Discharge status: Home / Self Care | Payer: BC Managed Care – PPO | Source: Ambulatory Visit | Attending: Nurse Practitioner | Admitting: Nurse Practitioner

## 2021-04-06 DIAGNOSIS — Z170 Estrogen receptor positive status [ER+]: Secondary | ICD-10-CM

## 2021-04-06 DIAGNOSIS — Z853 Personal history of malignant neoplasm of breast: Secondary | ICD-10-CM | POA: Diagnosis not present

## 2021-04-06 DIAGNOSIS — R921 Mammographic calcification found on diagnostic imaging of breast: Secondary | ICD-10-CM | POA: Diagnosis not present

## 2021-04-06 HISTORY — DX: Personal history of irradiation: Z92.3

## 2021-04-11 ENCOUNTER — Other Ambulatory Visit: Payer: Self-pay

## 2021-04-11 DIAGNOSIS — E039 Hypothyroidism, unspecified: Secondary | ICD-10-CM

## 2021-04-11 MED ORDER — LEVOTHYROXINE SODIUM 75 MCG PO TABS: 75.0000 ug | ORAL_TABLET | Freq: Every day | ORAL | 0 refills | Status: DC

## 2021-04-19 DIAGNOSIS — M79672 Pain in left foot: Secondary | ICD-10-CM | POA: Diagnosis not present

## 2021-04-25 ENCOUNTER — Other Ambulatory Visit: Payer: Self-pay

## 2021-04-25 ENCOUNTER — Encounter: Payer: Self-pay | Admitting: Nurse Practitioner

## 2021-04-25 ENCOUNTER — Inpatient Hospital Stay: Payer: BC Managed Care – PPO | Attending: Nurse Practitioner

## 2021-04-25 DIAGNOSIS — C50412 Malignant neoplasm of upper-outer quadrant of left female breast: Secondary | ICD-10-CM | POA: Insufficient documentation

## 2021-04-25 DIAGNOSIS — Z170 Estrogen receptor positive status [ER+]: Secondary | ICD-10-CM | POA: Insufficient documentation

## 2021-04-25 LAB — CBC WITH DIFFERENTIAL (CANCER CENTER ONLY)
Abs Immature Granulocytes: 0.02 10*3/uL (ref 0.00–0.07)
Basophils Absolute: 0 10*3/uL (ref 0.0–0.1)
Basophils Relative: 0 %
Eosinophils Absolute: 0 10*3/uL (ref 0.0–0.5)
Eosinophils Relative: 0 %
HCT: 46.1 % — ABNORMAL HIGH (ref 36.0–46.0)
Hemoglobin: 15.5 g/dL — ABNORMAL HIGH (ref 12.0–15.0)
Immature Granulocytes: 0 %
Lymphocytes Relative: 28 %
Lymphs Abs: 1.9 10*3/uL (ref 0.7–4.0)
MCH: 31.6 pg (ref 26.0–34.0)
MCHC: 33.6 g/dL (ref 30.0–36.0)
MCV: 94.1 fL (ref 80.0–100.0)
Monocytes Absolute: 0.6 10*3/uL (ref 0.1–1.0)
Monocytes Relative: 9 %
Neutro Abs: 4.3 10*3/uL (ref 1.7–7.7)
Neutrophils Relative %: 63 %
Platelet Count: 244 10*3/uL (ref 150–400)
RBC: 4.9 MIL/uL (ref 3.87–5.11)
RDW: 12.4 % (ref 11.5–15.5)
WBC Count: 6.8 10*3/uL (ref 4.0–10.5)
nRBC: 0 % (ref 0.0–0.2)

## 2021-04-25 LAB — CMP (CANCER CENTER ONLY)
ALT: 35 U/L (ref 0–44)
AST: 32 U/L (ref 15–41)
Albumin: 4.5 g/dL (ref 3.5–5.0)
Alkaline Phosphatase: 68 U/L (ref 38–126)
Anion gap: 7 (ref 5–15)
BUN: 18 mg/dL (ref 8–23)
CO2: 30 mmol/L (ref 22–32)
Calcium: 10.2 mg/dL (ref 8.9–10.3)
Chloride: 101 mmol/L (ref 98–111)
Creatinine: 0.93 mg/dL (ref 0.44–1.00)
GFR, Estimated: >60 mL/min (ref >60)
Glucose, Bld: 118 mg/dL — ABNORMAL HIGH (ref 70–99)
Potassium: 4.9 mmol/L (ref 3.5–5.1)
Sodium: 138 mmol/L (ref 135–145)
Total Bilirubin: 0.6 mg/dL (ref 0.3–1.2)
Total Protein: 8.2 g/dL — ABNORMAL HIGH (ref 6.5–8.1)

## 2021-05-17 DIAGNOSIS — M79672 Pain in left foot: Secondary | ICD-10-CM | POA: Diagnosis not present

## 2021-05-24 ENCOUNTER — Other Ambulatory Visit: Payer: Self-pay | Admitting: Hematology

## 2021-06-06 ENCOUNTER — Other Ambulatory Visit: Payer: Self-pay | Admitting: Family Medicine

## 2021-06-06 ENCOUNTER — Encounter: Payer: Self-pay | Admitting: Family Medicine

## 2021-06-06 ENCOUNTER — Ambulatory Visit: Payer: BC Managed Care – PPO | Admitting: Family Medicine

## 2021-06-06 VITALS — BP 135/83 | HR 90 | Ht 63.0 in | Wt 178.0 lb

## 2021-06-06 DIAGNOSIS — Z13220 Encounter for screening for lipoid disorders: Secondary | ICD-10-CM | POA: Diagnosis not present

## 2021-06-06 DIAGNOSIS — I10 Essential (primary) hypertension: Secondary | ICD-10-CM | POA: Diagnosis not present

## 2021-06-06 DIAGNOSIS — E039 Hypothyroidism, unspecified: Secondary | ICD-10-CM | POA: Diagnosis not present

## 2021-06-06 MED ORDER — LEVOTHYROXINE SODIUM 75 MCG PO TABS: 75.0000 ug | ORAL_TABLET | Freq: Every day | ORAL | 3 refills | Status: DC

## 2021-06-06 MED ORDER — HYDROCHLOROTHIAZIDE 25 MG PO TABS: 25.0000 mg | ORAL_TABLET | Freq: Every day | ORAL | 3 refills | Status: DC

## 2021-06-06 MED ORDER — AMLODIPINE BESYLATE 5 MG PO TABS: 5.0000 mg | ORAL_TABLET | Freq: Every day | ORAL | 3 refills | Status: DC

## 2021-06-06 MED ORDER — CARVEDILOL 12.5 MG PO TABS: 12.5000 mg | ORAL_TABLET | Freq: Two times a day (BID) | ORAL | 1 refills | Status: DC

## 2021-06-06 NOTE — Progress Notes (Signed)
Margaret Best - 61 y.o. female MRN [id number]  Date of birth: 05/01/1960  Subjective Chief Complaint  Patient presents with   Hypertension    HPI Margaret Best is a 61 year old female here today for follow-up visit.  Reports overall she is feeling well.  Blood pressure has been elevated at home.  Similar to initial reading here in the clinic.  She has not had symptoms related to hypertension including chest pain, shortness of breath, palpitations, headaches or vision changes.  She is taking amlodipine and, hydrochlorothiazide and metoprolol daily.  She did try amlodipine at higher strength however had increased swelling.    Feels good at current dose of levothyroxine.,  Due for updated labs.  ROS:  A comprehensive ROS was completed and negative except as noted per HPI  No Known Allergies  Past Medical History:  Diagnosis Date   Ectopic pregnancy    left   Hormone disorder    HYPOTHYROIDISM   Hypertension    Missed ab    FIRST TRIMESTER   Personal history of radiation therapy    Thyroid disease    Toxemia     Past Surgical History:  Procedure Laterality Date   BREAST LUMPECTOMY Left 02/06/2020   re-excision a week later   CESAREAN SECTION     DILATION AND CURETTAGE OF UTERUS     ENTEROLYSIS  1988   SBO   SALPINGECTOMY  1987   left   TUBAL LIGATION      Social History   Socioeconomic History   Marital status: Divorced    Spouse name: Not on file   Number of children: 2   Years of education: Not on file   Highest education level: Not on file  Occupational History   Not on file  Tobacco Use   Smoking status: Never   Smokeless tobacco: Never  Vaping Use   Vaping Use: Never used  Substance and Sexual Activity   Alcohol use: Yes    Alcohol/week: 5.0 standard drinks    Types: 5 Glasses of wine per week   Drug use: No   Sexual activity: Not Currently    Comment: 1st intercourse 61 yo-More than 5 partners  Other Topics Concern   Not on file  Social History  Narrative   Not on file   Social Determinants of Health   Financial Resource Strain: Not on file  Food Insecurity: Not on file  Transportation Needs: Not on file  Physical Activity: Not on file  Stress: Not on file  Social Connections: Not on file    Family History  Problem Relation Age of Onset   Hypertension Mother    Stroke Mother    Heart failure Mother    Diabetes Father     Health Maintenance  Topic Date Due   PAP SMEAR-Modifier  06/06/2021 (Originally 07/08/2018)   COVID-19 Vaccine (5 - Booster for [place]in series) 09/06/2021 (Originally 05/03/2020)   Zoster Vaccines- Shingrix (1 of 2) 09/06/2021 (Originally 01/07/1979)   COLONOSCOPY (Pts 45-68yr Insurance coverage will need to be confirmed)  06/07/2022 (Originally 01/06/2005)   Hepatitis C Screening  06/07/2022 (Originally 01/06/1978)   HIV Screening  06/07/2022 (Originally 01/07/1975)   INFLUENZA VACCINE  08/02/2021   MAMMOGRAM  04/07/2023   TETANUS/TDAP  01/06/2025   HPV VACCINES  Aged Out     ----------------------------------------------------------------------------------------------------------------------------------------------------------------------------------------------------------------- Physical Exam BP 135/83 (BP Location: Right Arm, Patient Position: Sitting, Cuff Size: Normal)   Pulse 90   Ht '5\' 3"'$  (1.6 m)   Wt 178 lb (80.7  kg)   LMP 01/20/2012   SpO2 98%   BMI 31.53 kg/m   Physical Exam Constitutional:      Appearance: Normal appearance.  Eyes:     General: No scleral icterus. Cardiovascular:     Rate and Rhythm: Normal rate and regular rhythm.  Pulmonary:     Effort: Pulmonary effort is normal.     Breath sounds: Normal breath sounds.  Musculoskeletal:     Cervical back: Neck supple.  Neurological:     Mental Status: She is alert.  Psychiatric:        Mood and Affect: Mood normal.        Behavior: Behavior normal.     ------------------------------------------------------------------------------------------------------------------------------------------------------------------------------------------------------------------- Assessment and Plan  HTN (hypertension) Blood pressure is mildly elevated.  Changing metoprolol to carvedilol at 12.5 mg twice daily.  Continue amlodipine and hydrochlorothiazide at current strength.  Hypothyroidism She feels good with current dose of levothyroxine.  Updated TSH.   Meds ordered this encounter  Medications   amLODipine (NORVASC) 5 MG tablet    Sig: Take 1 tablet (5 mg total) by mouth daily.    Dispense:  90 tablet    Refill:  3   hydrochlorothiazide (HYDRODIURIL) 25 MG tablet    Sig: Take 1 tablet (25 mg total) by mouth daily.    Dispense:  90 tablet    Refill:  3   levothyroxine (SYNTHROID) 75 MCG tablet    Sig: Take 1 tablet (75 mcg total) by mouth daily.    Dispense:  90 tablet    Refill:  3   carvedilol (COREG) 12.5 MG tablet    Sig: Take 1 tablet (12.5 mg total) by mouth 2 (two) times daily with a meal.    Dispense:  180 tablet    Refill:  1    Return in about 4 months (around 10/06/2021) for HTN.    This visit occurred during the SARS-CoV-2 public health emergency.  Safety protocols were in place, including screening questions prior to the visit, additional usage of staff PPE, and extensive cleaning of exam room while observing appropriate contact time as indicated for disinfecting solutions.

## 2021-06-06 NOTE — Assessment & Plan Note (Signed)
She feels good with current dose of levothyroxine.  Updated TSH.

## 2021-06-06 NOTE — Assessment & Plan Note (Addendum)
Blood pressure is mildly elevated.  Changing metoprolol to carvedilol at 12.5 mg twice daily.  Continue amlodipine and hydrochlorothiazide at current strength.

## 2021-06-06 NOTE — Patient Instructions (Signed)
Discontinue metoprolol.  Start carvedilol to replace this.  Continue to monitor blood pressure.  We'll be in touch with lab results.

## 2021-06-07 LAB — TSH: TSH: 1.47 mIU/L (ref 0.40–4.50)

## 2021-06-07 LAB — LIPID PANEL W/REFLEX DIRECT LDL
Cholesterol: 201 mg/dL — ABNORMAL HIGH (ref <200)
HDL: 60 mg/dL (ref >=50)
LDL Cholesterol (Calc): 115 mg/dL (calc) — ABNORMAL HIGH
Non-HDL Cholesterol (Calc): 141 mg/dL (calc) — ABNORMAL HIGH (ref <130)
Total CHOL/HDL Ratio: 3.4 (calc) (ref <5.0)
Triglycerides: 147 mg/dL (ref <150)

## 2021-06-14 DIAGNOSIS — M79672 Pain in left foot: Secondary | ICD-10-CM | POA: Diagnosis not present

## 2021-06-28 ENCOUNTER — Other Ambulatory Visit: Payer: Self-pay

## 2021-06-28 DIAGNOSIS — Z170 Estrogen receptor positive status [ER+]: Secondary | ICD-10-CM

## 2021-07-03 NOTE — Progress Notes (Signed)
[facility]   [phone] Fax:([phone]) [phone]   Clinic Follow up Note   Patient Care Team: [name], DO as PCP - General (Family Medicine) [name], MD as Consulting Physician (Hematology) [name], MD as Attending Physician (Radiation Oncology) [name], NP as Nurse Practitioner (Nurse Practitioner) 07/04/2021  CHIEF COMPLAINT: Follow up left breast cancer   SUMMARY OF ONCOLOGIC HISTORY: Oncology History Overview Note  Cancer Staging Carcinoma of upper-outer quadrant of left breast in female, estrogen receptor positive ([place]) Staging form: Breast, AJCC 8th Edition - Pathologic stage from 03/08/2020: Stage IA (pT1b, pN0, cM0, G1, ER+, PR+, HER2-) - Signed by [name], MD on 03/08/2020 Stage prefix: Initial diagnosis Histologic grading system: 3 grade system    Carcinoma of upper-outer quadrant of left breast in female, estrogen receptor positive ([place])  12/08/2019 Breast US   Left breast US 12/08/19 Findings:  At the left 12:00 position 7cmfn there is a small mass 1.2x1x0.7cm with spiculated margins and associated calcifications.    01/25/2020 Breast MRI   MRI Breast 01/25/20 Impression Left bresat with lobular enhanceing mass 11:00 position, 6.3cm from nipple, measuring 0.8x1.4x1cm. This is suspicious.  There is linear ductal enhanement extending posteriorly 1.1cm and 2 areas of linear non-mass enhancent extending exteriorly measuring 0.8 and 3.1cm in length. There may represent insitue cancer.    01/28/2020 Initial Biopsy   OUTSIDE SCAN   02/06/2020 Surgery   Left Lumpectomy by Dr Regan Rakers     02/12/2020 Surgery   Left breast re-excision surgery by Dr Regan Rakers     03/08/2020 Initial Diagnosis   Carcinoma of upper-outer quadrant of left breast in female, estrogen receptor positive ([place])   03/08/2020 Cancer Staging   Staging form: Breast, AJCC 8th Edition - Pathologic stage from 03/08/2020: Stage IA (pT1b, pN0, cM0, G1, ER+, PR+,  HER2-) - Signed by [name], MD on 03/08/2020 Stage prefix: Initial diagnosis Histologic grading system: 3 grade system   03/25/2020 - 04/21/2020 Radiation Therapy   Adjuvant radiation with Dr Isidore Moos    05/2020 -  Anti-estrogen oral therapy   Anastrozole 32m daily starting in 05/2020   08/17/2020 Survivorship   SCP delivered via virtual encounter by LCira Rue NP     CURRENT THERAPY: Anastrozole, starting 05/2020  INTERVAL HISTORY: Margaret Best for follow up as scheduled. Last seen by me 03/07/21. She had mild hypercalcemia (10.6) and transaminitis (AST 45, ALT 51) that normalized on recheck 6 weeks later. Mammogram 04/06/21 showed stable calcifications in the right breast but a 0.3 cm group of calcifications lateral to the lumpectomy site in the left breast was changed/new.  It was discussed to proceed with biopsy versus close follow-up mammo in 6 months, the decision was made to repeat in 6 months.  She has occasional discomfort in the left axilla, feels something "like a vein" but not currently.  She did PT in the past but only 2 sessions, thinks she might want to go back.  Denies new concerns in the breast such as palpable lump/mass, nipple discharge or inversion, or skin change.  She continues anastrozole, tolerating well with mild hot flashes.  She had a painful muscle behind the right knee and right wrist pain for 1 day that resolved.  Denies other significant bone/joint pain.  [place]  All other systems were reviewed with the patient and are negative.  MEDICAL HISTORY:  Past Medical History:  Diagnosis Date   Ectopic pregnancy  left   Hormone disorder    HYPOTHYROIDISM   Hypertension    Missed ab    FIRST TRIMESTER   Personal history of radiation therapy    Thyroid disease    Toxemia     SURGICAL HISTORY: Past Surgical History:  Procedure Laterality Date   BREAST LUMPECTOMY Left 02/06/2020   re-excision a week later    [place]     ENTEROLYSIS  1988   SBO   SALPINGECTOMY  1987   left   TUBAL LIGATION      I have reviewed the social history and family history with the patient and they are unchanged from previous note.  ALLERGIES:  has No Known Allergies.  MEDICATIONS:  Current Outpatient Medications  Medication Sig Dispense Refill   amLODipine (NORVASC) 5 MG tablet Take 1 tablet (5 mg total) by mouth daily. 90 tablet 3   anastrozole (ARIMIDEX) 1 MG tablet TAKE 1 TABLET(1 MG) BY MOUTH DAILY 90 tablet 1   carvedilol (COREG) 12.5 MG tablet Take 1 tablet (12.5 mg total) by mouth 2 (two) times daily with a meal. 180 tablet 1   hydrochlorothiazide (HYDRODIURIL) 25 MG tablet Take 1 tablet (25 mg total) by mouth daily. 90 tablet 3   levothyroxine (SYNTHROID) 75 MCG tablet Take 1 tablet (75 mcg total) by mouth daily. 90 tablet 3   LORazepam (ATIVAN) 0.5 MG tablet Take 1 tablet (0.5 mg total) by mouth daily as needed for anxiety. Before radiation as needed 15 tablet 0   No current facility-administered medications for this visit.    PHYSICAL EXAMINATION: ECOG PERFORMANCE STATUS: 1 - Symptomatic but completely ambulatory  Vitals:   07/04/21 1138  BP: (!) 165/96  Pulse: 74  Resp: 16  Temp: 98.1 F (36.7 C)  SpO2: 98%   Filed Weights   07/04/21 1138  Weight: 178 lb (80.7 kg)    GENERAL:alert, no distress and comfortable SKIN: No rash EYES: sclera clear NECK: Without mass LYMPH:  no palpable cervical or supraclavicular lymphadenopathy  LUNGS: normal breathing effort HEART: no lower extremity edema ABDOMEN:abdomen soft, non-tender and normal bowel sounds.  ?  Fullness over the RUQ without hepatomegaly or mass Musculoskeletal: No focal tenderness NEURO: alert & oriented x 3 with fluent speech, no focal motor/sensory deficits Breast exam: Breasts are symmetrical without nipple discharge or inversion.  S/p left lumpectomy and radiation, incisions  completely healed with minimal scar tissue.  Bilateral TTP throughout the breasts.  No palpable mass or nodularity in either breast or axilla that I could appreciate.  LABORATORY DATA:  I have reviewed the data as listed    Latest Ref Rng & Units 07/04/2021   11:07 AM 04/25/2021   11:06 AM 03/07/2021    9:21 AM  CBC  WBC 4.0 - 10.5 K/uL 6.5  6.8  6.5   Hemoglobin 12.0 - 15.0 g/dL 14.9  15.5  15.2   Hematocrit 36.0 - 46.0 % 43.2  46.1  45.3   Platelets 150 - 400 K/uL 249  244  230         Latest Ref Rng & Units 07/04/2021   11:07 AM 04/25/2021   11:06 AM 03/07/2021    9:21 AM  CMP  Glucose 70 - 99 mg/dL 138  118  133   BUN 8 - 23 mg/dL _0 Creatinine 0.44 - 1.00 mg/dL 0.90  0.93  1.00   Sodium 135 - 145  mmol/L 139  138  138   Potassium 3.5 - 5.1 mmol/L 4.9  4.9  4.8   Chloride 98 - 111 mmol/L 102  101  103   CO2 22 - 32 mmol/L _0 Calcium 8.9 - 10.3 mg/dL 9.7  10.2  10.6   Total Protein 6.5 - 8.1 g/dL 7.8  8.2  8.0   Total Bilirubin 0.3 - 1.2 mg/dL 0.5  0.6  0.6   Alkaline Phos 38 - 126 U/L 74  68  60   AST 15 - 41 U/L 55  32  45   ALT 0 - 44 U/L 46  35  51       RADIOGRAPHIC STUDIES: I have personally reviewed the radiological images as listed and agreed with the findings in the report. No results found.   ASSESSMENT & PLAN: Margaret Best is a 61 y.o. female with    1. Left breast cancer, Stage IA, p(T1bN0M0), ER+/PR+/HER2-, Grade I  -Diagnosed in 01/2020. S/p left lumpectomy by Dr Johny Blamer in [place] on 02/06/20 and re-excision on 02/12/20 for close margin and hematoma evacuation. Surgical path showed 0.9 mm invasive ductal carcinoma, grade 1, post ER and PR positive, HER-2 negative, with negative margins and negative lymph nodes. She overall has low risk of recurrence, oncotype not recommended.  -She received adjuvant radiation with Dr Isidore Moos 03/25/20-04/21/20 -She began anastrozole in 05/2020, tolerating well with mild hot flashes/night sweats. -Mammogram  04/06/21 showed stable calcifications in the right breast but a 0.3 cm group of calcifications lateral to the lumpectomy site in the left breast that represented a change/new.  It was discussed to proceed with biopsy versus close follow-up mammo in 6 months, the decision was made to repeat in 6 months.  I discussed if she becomes worried, weighing heavily on her or affects quality of life we can proceed with further work-up sooner -Mrs. Worst is clinically doing well.  Tolerating anastrozole with mild hot flashes/night sweats.  Breast exam is benign.  -Labs reviewed, CBC is normal, CMP reflects recurrent mild transaminitis.  She thinks she has a fatty liver but has not been confirmed.  We discussed further eval with abdominal ultrasound, she is interested and will arrange when she returns from [place].  I have a very low suspicion this is cancer related -Overall no clinical concern for breast cancer recurrence.  -She is 1.5 years from diagnosis, continue surveillance and anastrozole -F/up in 6 months, or sooner if needed   2. Elevated AST/ALT -She has had mild intermittent transaminitis since at least 11/08/2020 with ALT of 49.  On 03/07/2021 ALT 51 and AST 45, alk phos and T. bili normal.  Exam is benign -6 weeks later at recheck LFTs normalized -Today she has mild recurrent transaminitis. We discussed possibly from AI. Pt think she may have fatty liver? -Proceed with further work-up with abdominal ultrasound in 4-5 weeks when she returns from [place]   3. Bone health  -baseline DEXA on 05/19/20 was normal (T-score of -0.9) -she understands AIs can lower bone density -She does not take calcium due to high calcium in 03/2021, she takes vitamin D supplement. I encouraged her to increase weight bearing exercise such as walking.  -Repeat DEXA 5/24   4. HTN, hypothyroidism -continue med regimen, f/up PCP  Plan: -04/2021 Mammo/ultrasound and todays labs reviewed -Continue breast cancer surveillance and  anastrozole -Abdominal/liver ultrasound in August to evaluate transaminitis -Repeat left mammo/ultrasound 10/2021 to monitor left breast calcifications -Surveillance visit  in 6 months, or sooner if needed    Orders Placed This Encounter  Procedures   US Abdomen Limited    Standing Status:   Future    Standing Expiration Date:   07/04/2022    Order Specific Question:   Reason for Exam (SYMPTOM  OR DIAGNOSIS REQUIRED)    Answer:   h/o breast cancer on AI, intermittent transaminitis    Order Specific Question:   Preferred imaging location?    Answer:   [facility]   All questions were answered. The patient knows to call the clinic with any problems, questions or concerns. No barriers to learning was detected. I spent 20 minutes counseling the patient face to face. The total time spent in the appointment was 30 minutes and more than 50% was on counseling and review of test results.     [name], NP 07/04/21

## 2021-07-04 ENCOUNTER — Encounter: Payer: Self-pay | Admitting: Nurse Practitioner

## 2021-07-04 ENCOUNTER — Other Ambulatory Visit: Payer: Self-pay

## 2021-07-04 ENCOUNTER — Inpatient Hospital Stay (HOSPITAL_BASED_OUTPATIENT_CLINIC_OR_DEPARTMENT_OTHER): Payer: BC Managed Care – PPO | Admitting: Nurse Practitioner

## 2021-07-04 ENCOUNTER — Inpatient Hospital Stay: Payer: BC Managed Care – PPO | Attending: Nurse Practitioner

## 2021-07-04 VITALS — BP 165/96 | HR 74 | Temp 98.1°F | Resp 16 | Ht 63.0 in | Wt 178.0 lb

## 2021-07-04 DIAGNOSIS — R7401 Elevation of levels of liver transaminase levels: Secondary | ICD-10-CM | POA: Diagnosis not present

## 2021-07-04 DIAGNOSIS — Z79811 Long term (current) use of aromatase inhibitors: Secondary | ICD-10-CM | POA: Insufficient documentation

## 2021-07-04 DIAGNOSIS — Z170 Estrogen receptor positive status [ER+]: Secondary | ICD-10-CM

## 2021-07-04 DIAGNOSIS — C50412 Malignant neoplasm of upper-outer quadrant of left female breast: Secondary | ICD-10-CM | POA: Insufficient documentation

## 2021-07-04 LAB — COMPREHENSIVE METABOLIC PANEL
ALT: 46 U/L — ABNORMAL HIGH (ref 0–44)
AST: 55 U/L — ABNORMAL HIGH (ref 15–41)
Albumin: 4.3 g/dL (ref 3.5–5.0)
Alkaline Phosphatase: 74 U/L (ref 38–126)
Anion gap: 7 (ref 5–15)
BUN: 15 mg/dL (ref 8–23)
CO2: 30 mmol/L (ref 22–32)
Calcium: 9.7 mg/dL (ref 8.9–10.3)
Chloride: 102 mmol/L (ref 98–111)
Creatinine, Ser: 0.9 mg/dL (ref 0.44–1.00)
GFR, Estimated: >60 mL/min (ref >60)
Glucose, Bld: 138 mg/dL — ABNORMAL HIGH (ref 70–99)
Potassium: 4.9 mmol/L (ref 3.5–5.1)
Sodium: 139 mmol/L (ref 135–145)
Total Bilirubin: 0.5 mg/dL (ref 0.3–1.2)
Total Protein: 7.8 g/dL (ref 6.5–8.1)

## 2021-07-04 LAB — CBC WITH DIFFERENTIAL/PLATELET
Abs Immature Granulocytes: 0.02 10*3/uL (ref 0.00–0.07)
Basophils Absolute: 0 10*3/uL (ref 0.0–0.1)
Basophils Relative: 1 %
Eosinophils Absolute: 0.1 10*3/uL (ref 0.0–0.5)
Eosinophils Relative: 1 %
HCT: 43.2 % (ref 36.0–46.0)
Hemoglobin: 14.9 g/dL (ref 12.0–15.0)
Immature Granulocytes: 0 %
Lymphocytes Relative: 22 %
Lymphs Abs: 1.4 10*3/uL (ref 0.7–4.0)
MCH: 32.3 pg (ref 26.0–34.0)
MCHC: 34.5 g/dL (ref 30.0–36.0)
MCV: 93.7 fL (ref 80.0–100.0)
Monocytes Absolute: 0.4 10*3/uL (ref 0.1–1.0)
Monocytes Relative: 7 %
Neutro Abs: 4.5 10*3/uL (ref 1.7–7.7)
Neutrophils Relative %: 69 %
Platelets: 249 10*3/uL (ref 150–400)
RBC: 4.61 MIL/uL (ref 3.87–5.11)
RDW: 13.1 % (ref 11.5–15.5)
WBC: 6.5 10*3/uL (ref 4.0–10.5)
nRBC: 0 % (ref 0.0–0.2)

## 2021-07-06 DIAGNOSIS — M79672 Pain in left foot: Secondary | ICD-10-CM | POA: Diagnosis not present

## 2021-09-21 ENCOUNTER — Other Ambulatory Visit: Payer: Self-pay

## 2021-09-21 DIAGNOSIS — I10 Essential (primary) hypertension: Secondary | ICD-10-CM

## 2021-09-21 MED ORDER — HYDROCHLOROTHIAZIDE 25 MG PO TABS: 25.0000 mg | ORAL_TABLET | Freq: Every day | ORAL | 1 refills | Status: DC

## 2021-10-06 ENCOUNTER — Ambulatory Visit: Payer: BC Managed Care – PPO | Admitting: Family Medicine

## 2021-11-02 ENCOUNTER — Other Ambulatory Visit: Payer: Self-pay | Admitting: Nurse Practitioner

## 2021-11-15 DIAGNOSIS — M79672 Pain in left foot: Secondary | ICD-10-CM | POA: Diagnosis not present

## 2021-12-05 ENCOUNTER — Other Ambulatory Visit: Payer: Self-pay

## 2021-12-05 DIAGNOSIS — I10 Essential (primary) hypertension: Secondary | ICD-10-CM

## 2021-12-05 MED ORDER — AMLODIPINE BESYLATE 5 MG PO TABS: 5.0000 mg | ORAL_TABLET | Freq: Every day | ORAL | 0 refills | Status: DC

## 2021-12-06 NOTE — Progress Notes (Signed)
Scheduled appointment for 12/08/2021 @ 2:10. Tvt

## 2021-12-08 ENCOUNTER — Encounter: Payer: Self-pay | Admitting: Family Medicine

## 2021-12-08 ENCOUNTER — Ambulatory Visit: Payer: BC Managed Care – PPO | Admitting: Family Medicine

## 2021-12-08 VITALS — BP 147/88 | HR 103 | Ht 63.0 in | Wt 177.0 lb

## 2021-12-08 DIAGNOSIS — I10 Essential (primary) hypertension: Secondary | ICD-10-CM

## 2021-12-08 DIAGNOSIS — E039 Hypothyroidism, unspecified: Secondary | ICD-10-CM

## 2021-12-08 DIAGNOSIS — I890 Lymphedema, not elsewhere classified: Secondary | ICD-10-CM

## 2021-12-08 DIAGNOSIS — C50412 Malignant neoplasm of upper-outer quadrant of left female breast: Secondary | ICD-10-CM

## 2021-12-08 DIAGNOSIS — Z170 Estrogen receptor positive status [ER+]: Secondary | ICD-10-CM

## 2021-12-08 NOTE — Assessment & Plan Note (Signed)
Blood pressure is elevated today.  Recommend monitoring at home over the next couple weeks.  Instructed to send readings via MyChart.  Low-sodium diet encouraged.

## 2021-12-08 NOTE — Assessment & Plan Note (Addendum)
She has noticed some lymphedema in the left arm.  Requesting referral back to occupational therapy.  Orders placed to rehab without walls per her request.

## 2021-12-08 NOTE — Assessment & Plan Note (Signed)
Stable with levothyroxine at current strength.  Will continue.

## 2021-12-08 NOTE — Progress Notes (Signed)
Margaret Best - 61 y.o. female MRN [id number]  Date of birth: [birth year]  Subjective Chief Complaint  Patient presents with   Hypertension    HPI Margaret Best is a 61 y.o. female here today for follow-up.    Continues on combination of amlodipine, carvedilol and hydrochlorothiazide for management of hypertension.  He denies side effects from medication at current strength.  She has not had symptoms related to hypertension including chest pain, shortness of breath, palpitations, headaches or vision changes.  She has not checked her blood pressure at home recently.  Feels pretty good on current dose of levothyroxine.   Lab Results  Component Value Date   TSH 1.47 06/06/2021   ROS:  A comprehensive ROS was completed and negative except as noted per HPI  No Known Allergies  Past Medical History:  Diagnosis Date   Ectopic pregnancy    left   Hormone disorder    HYPOTHYROIDISM   Hypertension    Missed ab    FIRST TRIMESTER   Personal history of radiation therapy    Thyroid disease    Toxemia     Past Surgical History:  Procedure Laterality Date   BREAST LUMPECTOMY Left 02/06/2020   re-excision a week later   CESAREAN SECTION     DILATION AND CURETTAGE OF UTERUS     ENTEROLYSIS  1988   SBO   SALPINGECTOMY  1987   left   TUBAL LIGATION      Social History   Socioeconomic History   Marital status: Divorced    Spouse name: Not on file   Number of children: 2   Years of education: Not on file   Highest education level: Not on file  Occupational History   Not on file  Tobacco Use   Smoking status: Never   Smokeless tobacco: Never  Vaping Use   Vaping Use: Never used  Substance and Sexual Activity   Alcohol use: Yes    Alcohol/week: 5.0 standard drinks of alcohol    Types: 5 Glasses of wine per week   Drug use: No   Sexual activity: Not Currently    Comment: 1st intercourse 61 yo-More than 5 partners  Other Topics Concern   Not on file  Social  History Narrative   Not on file   Social Determinants of Health   Financial Resource Strain: Not on file  Food Insecurity: Not on file  Transportation Needs: Not on file  Physical Activity: Not on file  Stress: Not on file  Social Connections: Not on file    Family History  Problem Relation Age of Onset   Hypertension Mother    Stroke Mother    Heart failure Mother    Diabetes Father     Health Maintenance  Topic Date Due   Zoster Vaccines- Shingrix (1 of 2) Never done   PAP SMEAR-Modifier  07/08/2018   INFLUENZA VACCINE  08/02/2021   COVID-19 Vaccine (5 - 2023-24 season) 09/02/2021   COLONOSCOPY (Pts 45-4yr Insurance coverage will need to be confirmed)  06/07/2022 (Originally 01/06/2005)   Hepatitis C Screening  06/07/2022 (Originally 01/06/1978)   HIV Screening  06/07/2022 (Originally 01/07/1975)   MAMMOGRAM  04/07/2023   DTaP/Tdap/Td (2 - Td or Tdap) 01/06/2025   HPV VACCINES  Aged Out     ----------------------------------------------------------------------------------------------------------------------------------------------------------------------------------------------------------------- Physical Exam BP (!) 147/88 (BP Location: Right Arm, Patient Position: Sitting, Cuff Size: Normal)   Pulse (!) 103   Ht '5\' 3"'$  (1.6 m)   Wt 177 lb (  80.3 kg)   LMP 01/20/2012   SpO2 98%   BMI 31.35 kg/m   Physical Exam Constitutional:      Appearance: Normal appearance.  HENT:     Head: Normocephalic and atraumatic.  Eyes:     General: No scleral icterus. Cardiovascular:     Rate and Rhythm: Normal rate and regular rhythm.  Pulmonary:     Effort: Pulmonary effort is normal.     Breath sounds: Normal breath sounds.  Musculoskeletal:     Cervical back: Neck supple.  Neurological:     General: No focal deficit present.     Mental Status: She is alert.  Psychiatric:        Mood and Affect: Mood normal.        Behavior: Behavior normal.      ------------------------------------------------------------------------------------------------------------------------------------------------------------------------------------------------------------------- Assessment and Plan  HTN (hypertension) Blood pressure is elevated today.  Recommend monitoring at home over the next couple weeks.  Instructed to send readings via MyChart.  Low-sodium diet encouraged.  Hypothyroidism Stable with levothyroxine at current strength.  Will continue.  Carcinoma of upper-outer quadrant of left breast in female, estrogen receptor positive ([place]) She has noticed some lymphedema in the left arm.  Requesting referral back to occupational therapy.  Orders placed to rehab without walls per her request.   No orders of the defined types were placed in this encounter.   No follow-ups on file.    This visit occurred during the SARS-CoV-2 public health emergency.  Safety protocols were in place, including screening questions prior to the visit, additional usage of staff PPE, and extensive cleaning of exam room while observing appropriate contact time as indicated for disinfecting solutions.

## 2021-12-22 ENCOUNTER — Other Ambulatory Visit: Payer: Self-pay | Admitting: Nurse Practitioner

## 2021-12-22 DIAGNOSIS — R928 Other abnormal and inconclusive findings on diagnostic imaging of breast: Secondary | ICD-10-CM

## 2022-01-09 ENCOUNTER — Ambulatory Visit
Admission: RE | Admit: 2022-01-09 | Discharge: 2022-01-09 | Disposition: A | Discharge status: Home / Self Care | Payer: BC Managed Care – PPO | Source: Ambulatory Visit | Attending: Nurse Practitioner | Admitting: Nurse Practitioner

## 2022-01-09 DIAGNOSIS — R928 Other abnormal and inconclusive findings on diagnostic imaging of breast: Secondary | ICD-10-CM

## 2022-01-10 ENCOUNTER — Other Ambulatory Visit: Payer: Self-pay

## 2022-01-10 DIAGNOSIS — R7401 Elevation of levels of liver transaminase levels: Secondary | ICD-10-CM | POA: Insufficient documentation

## 2022-01-10 DIAGNOSIS — Z170 Estrogen receptor positive status [ER+]: Secondary | ICD-10-CM

## 2022-01-10 NOTE — Assessment & Plan Note (Signed)
-  mild and intermittent  -will continue monitoring

## 2022-01-10 NOTE — Assessment & Plan Note (Signed)
Stage IA, p(T1bN0M0), ER+/PR+/HER2-, Grade I  -Diagnosed in 01/2020. S/p left lumpectomy by Dr Johny Blamer in [place] on 02/06/20 and re-excision on 02/12/20 for close margin and hematoma evacuation. --She received adjuvant radiation with Dr Isidore Moos 03/25/20-04/21/20 -She began anastrozole in 05/2020, tolerating well with mild hot flashes/night sweats.

## 2022-01-10 NOTE — Progress Notes (Unsigned)
[facility]   [phone] [phone]   Clinic Follow up Note   Patient Care Team: [name], DO as PCP - General (Family Medicine) [name], MD as Consulting Physician (Hematology) [name], MD as Attending Physician (Radiation Oncology) [name], NP as Nurse Practitioner (Nurse Practitioner)  Date of Service:  01/10/2022  CHIEF COMPLAINT: f/u of left breast cancer   CURRENT THERAPY: Anastrozole, starting 05/2020    ASSESSMENT: *** Margaret Best is a 62 y.o. female with   No problem-specific Assessment & Plan notes found for this encounter.  ***   PLAN: {Everything Dr. Burr Medico talks to pt about, including reviewing scans and labs. } -{proceed with ***} -{lab with/without flush and f/u when?}   SUMMARY OF ONCOLOGIC HISTORY: Oncology History Overview Note  Cancer Staging Carcinoma of upper-outer quadrant of left breast in female, estrogen receptor positive ([place]) Staging form: Breast, AJCC 8th Edition - Pathologic stage from 03/08/2020: Stage IA (pT1b, pN0, cM0, G1, ER+, PR+, HER2-) - Signed by [name], MD on 03/08/2020 Stage prefix: Initial diagnosis Histologic grading system: 3 grade system    Carcinoma of upper-outer quadrant of left breast in female, estrogen receptor positive ([place])  12/08/2019 Breast US   Left breast US 12/08/19 Findings:  At the left 12:00 position 7cmfn there is a small mass 1.2x1x0.7cm with spiculated margins and associated calcifications.    01/25/2020 Breast MRI   MRI Breast 01/25/20 Impression Left bresat with lobular enhanceing mass 11:00 position, 6.3cm from nipple, measuring 0.8x1.4x1cm. This is suspicious.  There is linear ductal enhanement extending posteriorly 1.1cm and 2 areas of linear non-mass enhancent extending exteriorly measuring 0.8 and 3.1cm in length. There may represent insitue cancer.    01/28/2020 Initial Biopsy   OUTSIDE SCAN   02/06/2020 Surgery   Left Lumpectomy by  Dr Regan Rakers     02/12/2020 Surgery   Left breast re-excision surgery by Dr Regan Rakers     03/08/2020 Initial Diagnosis   Carcinoma of upper-outer quadrant of left breast in female, estrogen receptor positive ([place])   03/08/2020 Cancer Staging   Staging form: Breast, AJCC 8th Edition - Pathologic stage from 03/08/2020: Stage IA (pT1b, pN0, cM0, G1, ER+, PR+, HER2-) - Signed by [name], MD on 03/08/2020 Stage prefix: Initial diagnosis Histologic grading system: 3 grade system   03/25/2020 - 04/21/2020 Radiation Therapy   Adjuvant radiation with Dr Isidore Moos    05/2020 -  Anti-estrogen oral therapy   Anastrozole '1mg'$  daily starting in 05/2020   08/17/2020 Survivorship   SCP delivered via virtual encounter by Cira Rue, NP      INTERVAL HISTORY: *** Margaret Best is here for a follow up of left breast cancer    She was last seen by NP Lacie on 07/04/21 She presents to the clinic    All other systems were reviewed with the patient and are negative.  MEDICAL HISTORY:  Past Medical History:  Diagnosis Date   Ectopic pregnancy    left   Hormone disorder    HYPOTHYROIDISM   Hypertension    Missed ab    FIRST TRIMESTER   Personal history of radiation therapy    Thyroid disease    Toxemia     SURGICAL HISTORY: Past Surgical History:  Procedure Laterality Date   BREAST LUMPECTOMY Left 02/06/2020   re-excision a week later   [place]     ENTEROLYSIS  1988   SBO  SALPINGECTOMY  1987   left   TUBAL LIGATION      I have reviewed the social history and family history with the patient and they are unchanged from previous note.  ALLERGIES:  has No Known Allergies.  MEDICATIONS:  Current Outpatient Medications  Medication Sig Dispense Refill   amLODipine (NORVASC) 5 MG tablet Take 1 tablet (5 mg total) by mouth daily. 30 tablet 0   anastrozole (ARIMIDEX) 1 MG tablet TAKE 1 TABLET(1 MG) BY MOUTH DAILY 90 tablet 1    carvedilol (COREG) 12.5 MG tablet Take 1 tablet (12.5 mg total) by mouth 2 (two) times daily with a meal. 180 tablet 1   hydrochlorothiazide (HYDRODIURIL) 25 MG tablet Take 1 tablet (25 mg total) by mouth daily. 90 tablet 1   levothyroxine (SYNTHROID) 75 MCG tablet Take 1 tablet (75 mcg total) by mouth daily. 90 tablet 3   LORazepam (ATIVAN) 0.5 MG tablet Take 1 tablet (0.5 mg total) by mouth daily as needed for anxiety. Before radiation as needed 15 tablet 0   metoprolol tartrate (LOPRESSOR) 100 MG tablet Take 100 mg by mouth 2 (two) times daily.     No current facility-administered medications for this visit.    PHYSICAL EXAMINATION: ECOG PERFORMANCE STATUS: {CHL ONC ECOG [phone]}  There were no vitals filed for this visit. Wt Readings from Last 3 Encounters:  12/08/21 177 lb (80.3 kg)  07/04/21 178 lb (80.7 kg)  06/06/21 178 lb (80.7 kg)    {Only keep what was examined. If exam not performed, can use .CEXAM } GENERAL:alert, no distress and comfortable SKIN: skin color, texture, turgor are normal, no rashes or significant lesions EYES: normal, Conjunctiva are pink and non-injected, sclera clear {OROPHARYNX:no exudate, no erythema and lips, buccal mucosa, and tongue normal}  NECK: supple, thyroid normal size, non-tender, without nodularity LYMPH:  no palpable lymphadenopathy in the cervical, axillary {or inguinal} LUNGS: clear to auscultation and percussion with normal breathing effort HEART: regular rate & rhythm and no murmurs and no lower extremity edema ABDOMEN:abdomen soft, non-tender and normal bowel sounds Musculoskeletal:no cyanosis of digits and no clubbing  NEURO: alert & oriented x 3 with fluent speech, no focal motor/sensory deficits  LABORATORY DATA:  I have reviewed the data as listed    Latest Ref Rng & Units 07/04/2021   11:07 AM 04/25/2021   11:06 AM 03/07/2021    9:21 AM  CBC  WBC 4.0 - 10.5 K/uL 6.5  6.8  6.5   Hemoglobin 12.0 - 15.0 g/dL 14.9  15.5  15.2    Hematocrit 36.0 - 46.0 % 43.2  46.1  45.3   Platelets 150 - 400 K/uL 249  244  230         Latest Ref Rng & Units 07/04/2021   11:07 AM 04/25/2021   11:06 AM 03/07/2021    9:21 AM  CMP  Glucose 70 - 99 mg/dL 138  118  133   BUN 8 - 23 mg/dL '15  18  17   '$ Creatinine 0.44 - 1.00 mg/dL 0.90  0.93  1.00   Sodium 135 - 145 mmol/L 139  138  138   Potassium 3.5 - 5.1 mmol/L 4.9  4.9  4.8   Chloride 98 - 111 mmol/L 102  101  103   CO2 22 - 32 mmol/L '30  30  27   '$ Calcium 8.9 - 10.3 mg/dL 9.7  10.2  10.6   Total Protein 6.5 - 8.1 g/dL 7.8  8.2  8.0  Total Bilirubin 0.3 - 1.2 mg/dL 0.5  0.6  0.6   Alkaline Phos 38 - 126 U/L 74  68  60   AST 15 - 41 U/L 55  32  45   ALT 0 - 44 U/L 46  35  51       RADIOGRAPHIC STUDIES: I have personally reviewed the radiological images as listed and agreed with the findings in the report. MM Digital Diagnostic Unilat L  Result Date: 01/09/2022 CLINICAL DATA:  62 year old female presenting for slightly delayed six-month follow-up of left breast calcifications. EXAM: DIGITAL DIAGNOSTIC UNILATERAL LEFT MAMMOGRAM WITH CAD TECHNIQUE: Left digital diagnostic mammography was performed. COMPARISON:  Previous exam(s). ACR Breast Density Category b: There are scattered areas of fibroglandular density. FINDINGS: Grouped calcifications in the upper outer left breast at posterior depth, just lateral to the lumpectomy bed, are mammographically stable. No additional suspicious findings identified within the left breast. IMPRESSION: Stable left breast calcifications. Recommend continued short-term follow-up. RECOMMENDATION: Patient will be due for bilateral diagnostic mammogram in April 2024. I have discussed the findings and recommendations with the patient. If applicable, a reminder letter will be sent to the patient regarding the next appointment. BI-RADS CATEGORY  3: Probably benign. Electronically Signed   By: Kristopher Oppenheim M.D.   On: 01/09/2022 12:21     No orders of the  defined types were placed in this encounter.  All questions were answered. The patient knows to call the clinic with any problems, questions or concerns. No barriers to learning was detected. The total time spent in the appointment was {CHL ONC TIME VISIT - ACZYS:[id number]}.     Baldemar Friday, CMA 01/10/2022   I, Audry Riles, CMA, am acting as scribe for [name], MD.   {Add scribe attestation statement}

## 2022-01-11 ENCOUNTER — Other Ambulatory Visit: Payer: Self-pay

## 2022-01-11 ENCOUNTER — Encounter: Payer: Self-pay | Admitting: Hematology

## 2022-01-11 ENCOUNTER — Inpatient Hospital Stay: Payer: BC Managed Care – PPO | Admitting: Hematology

## 2022-01-11 ENCOUNTER — Inpatient Hospital Stay: Payer: BC Managed Care – PPO | Attending: Hematology

## 2022-01-11 VITALS — BP 168/98 | HR 95 | Temp 97.9°F | Resp 18 | Ht 63.0 in | Wt 176.0 lb

## 2022-01-11 DIAGNOSIS — C50412 Malignant neoplasm of upper-outer quadrant of left female breast: Secondary | ICD-10-CM | POA: Insufficient documentation

## 2022-01-11 DIAGNOSIS — Z170 Estrogen receptor positive status [ER+]: Secondary | ICD-10-CM

## 2022-01-11 DIAGNOSIS — R7401 Elevation of levels of liver transaminase levels: Secondary | ICD-10-CM

## 2022-01-11 DIAGNOSIS — Z923 Personal history of irradiation: Secondary | ICD-10-CM | POA: Insufficient documentation

## 2022-01-11 DIAGNOSIS — E2839 Other primary ovarian failure: Secondary | ICD-10-CM

## 2022-01-11 DIAGNOSIS — R232 Flushing: Secondary | ICD-10-CM | POA: Insufficient documentation

## 2022-01-11 DIAGNOSIS — Z79811 Long term (current) use of aromatase inhibitors: Secondary | ICD-10-CM | POA: Insufficient documentation

## 2022-01-11 LAB — CBC WITH DIFFERENTIAL (CANCER CENTER ONLY)
Abs Immature Granulocytes: 0.02 10*3/uL (ref 0.00–0.07)
Basophils Absolute: 0.1 10*3/uL (ref 0.0–0.1)
Basophils Relative: 1 %
Eosinophils Absolute: 0 10*3/uL (ref 0.0–0.5)
Eosinophils Relative: 1 %
HCT: 44.6 % (ref 36.0–46.0)
Hemoglobin: 15.2 g/dL — ABNORMAL HIGH (ref 12.0–15.0)
Immature Granulocytes: 0 %
Lymphocytes Relative: 26 %
Lymphs Abs: 1.7 10*3/uL (ref 0.7–4.0)
MCH: 31.9 pg (ref 26.0–34.0)
MCHC: 34.1 g/dL (ref 30.0–36.0)
MCV: 93.7 fL (ref 80.0–100.0)
Monocytes Absolute: 0.6 10*3/uL (ref 0.1–1.0)
Monocytes Relative: 9 %
Neutro Abs: 4.1 10*3/uL (ref 1.7–7.7)
Neutrophils Relative %: 63 %
Platelet Count: 308 10*3/uL (ref 150–400)
RBC: 4.76 MIL/uL (ref 3.87–5.11)
RDW: 12.8 % (ref 11.5–15.5)
WBC Count: 6.4 10*3/uL (ref 4.0–10.5)
nRBC: 0 % (ref 0.0–0.2)

## 2022-01-11 LAB — CMP (CANCER CENTER ONLY)
ALT: 28 U/L (ref 0–44)
AST: 25 U/L (ref 15–41)
Albumin: 4.3 g/dL (ref 3.5–5.0)
Alkaline Phosphatase: 68 U/L (ref 38–126)
Anion gap: 6 (ref 5–15)
BUN: 11 mg/dL (ref 8–23)
CO2: 30 mmol/L (ref 22–32)
Calcium: 10 mg/dL (ref 8.9–10.3)
Chloride: 102 mmol/L (ref 98–111)
Creatinine: 0.89 mg/dL (ref 0.44–1.00)
GFR, Estimated: >60 mL/min (ref >60)
Glucose, Bld: 125 mg/dL — ABNORMAL HIGH (ref 70–99)
Potassium: 4.6 mmol/L (ref 3.5–5.1)
Sodium: 138 mmol/L (ref 135–145)
Total Bilirubin: 0.6 mg/dL (ref 0.3–1.2)
Total Protein: 7.7 g/dL (ref 6.5–8.1)

## 2022-01-12 ENCOUNTER — Telehealth: Payer: Self-pay | Admitting: Hematology

## 2022-01-12 NOTE — Telephone Encounter (Signed)
Patient aware of upcoming appointments  

## 2022-01-19 ENCOUNTER — Telehealth: Payer: Self-pay | Admitting: Family Medicine

## 2022-01-19 DIAGNOSIS — I10 Essential (primary) hypertension: Secondary | ICD-10-CM

## 2022-01-19 NOTE — Telephone Encounter (Signed)
Patient is scheduled for an upcoming appointment she is requesting a 90 day supply on all her meds

## 2022-01-20 MED ORDER — METOPROLOL TARTRATE 100 MG PO TABS: 100.0000 mg | ORAL_TABLET | Freq: Two times a day (BID) | ORAL | 0 refills | Status: DC

## 2022-01-20 MED ORDER — CARVEDILOL 12.5 MG PO TABS: 12.5000 mg | ORAL_TABLET | Freq: Two times a day (BID) | ORAL | 0 refills | Status: DC

## 2022-01-20 MED ORDER — HYDROCHLOROTHIAZIDE 25 MG PO TABS: 25.0000 mg | ORAL_TABLET | Freq: Every day | ORAL | 0 refills | Status: DC

## 2022-01-20 MED ORDER — ANASTROZOLE 1 MG PO TABS: ORAL_TABLET | ORAL | 0 refills | Status: DC

## 2022-01-20 MED ORDER — AMLODIPINE BESYLATE 5 MG PO TABS: 5.0000 mg | ORAL_TABLET | Freq: Every day | ORAL | 0 refills | Status: DC

## 2022-01-20 NOTE — Telephone Encounter (Signed)
Refills sent to pharmacy. No additional refills unless upcoming appt is completed.

## 2022-01-24 ENCOUNTER — Encounter: Payer: BC Managed Care – PPO | Admitting: Family Medicine

## 2022-01-26 DIAGNOSIS — M79672 Pain in left foot: Secondary | ICD-10-CM | POA: Diagnosis not present

## 2022-02-16 ENCOUNTER — Ambulatory Visit (INDEPENDENT_AMBULATORY_CARE_PROVIDER_SITE_OTHER): Payer: BC Managed Care – PPO | Admitting: Family Medicine

## 2022-02-16 ENCOUNTER — Encounter: Payer: Self-pay | Admitting: Family Medicine

## 2022-02-16 VITALS — BP 135/84 | HR 102 | Ht 63.0 in | Wt 176.1 lb

## 2022-02-16 DIAGNOSIS — Z0000 Encounter for general adult medical examination without abnormal findings: Secondary | ICD-10-CM

## 2022-02-16 DIAGNOSIS — I10 Essential (primary) hypertension: Secondary | ICD-10-CM

## 2022-02-16 DIAGNOSIS — E039 Hypothyroidism, unspecified: Secondary | ICD-10-CM

## 2022-02-16 DIAGNOSIS — Z13220 Encounter for screening for lipoid disorders: Secondary | ICD-10-CM | POA: Diagnosis not present

## 2022-02-16 NOTE — Assessment & Plan Note (Signed)
Well adult Orders Placed This Encounter  Procedures   COMPLETE METABOLIC PANEL WITH GFR   CBC with Differential   Lipid Panel w/reflex Direct LDL   TSH  Screenings: per lab orders Immunizations: UTD Anticipatory guidance/Risk factor reduction:  Recommendations per AVS.

## 2022-02-16 NOTE — Patient Instructions (Signed)
Preventive Care 40-62 Years Old, Female Preventive care refers to lifestyle choices and visits with your health care provider that can promote health and wellness. Preventive care visits are also called wellness exams. What can I expect for my preventive care visit? Counseling Your health care provider may ask you questions about your: Medical history, including: Past medical problems. Family medical history. Pregnancy history. Current health, including: Menstrual cycle. Method of birth control. Emotional well-being. Home life and relationship well-being. Sexual activity and sexual health. Lifestyle, including: Alcohol, nicotine or tobacco, and drug use. Access to firearms. Diet, exercise, and sleep habits. Work and work environment. Sunscreen use. Safety issues such as seatbelt and bike helmet use. Physical exam Your health care provider will check your: Height and weight. These may be used to calculate your BMI (body mass index). BMI is a measurement that tells if you are at a healthy weight. Waist circumference. This measures the distance around your waistline. This measurement also tells if you are at a healthy weight and may help predict your risk of certain diseases, such as type 2 diabetes and high blood pressure. Heart rate and blood pressure. Body temperature. Skin for abnormal spots. What immunizations do I need?  Vaccines are usually given at various ages, according to a schedule. Your health care provider will recommend vaccines for you based on your age, medical history, and lifestyle or other factors, such as travel or where you work. What tests do I need? Screening Your health care provider may recommend screening tests for certain conditions. This may include: Lipid and cholesterol levels. Diabetes screening. This is done by checking your blood sugar (glucose) after you have not eaten for a while (fasting). Pelvic exam and Pap test. Hepatitis B test. Hepatitis C  test. HIV (human immunodeficiency virus) test. STI (sexually transmitted infection) testing, if you are at risk. Lung cancer screening. Colorectal cancer screening. Mammogram. Talk with your health care provider about when you should start having regular mammograms. This may depend on whether you have a family history of breast cancer. BRCA-related cancer screening. This may be done if you have a family history of breast, ovarian, tubal, or peritoneal cancers. Bone density scan. This is done to screen for osteoporosis. Talk with your health care provider about your test results, treatment options, and if necessary, the need for more tests. Follow these instructions at home: Eating and drinking  Eat a diet that includes fresh fruits and vegetables, whole grains, lean protein, and low-fat dairy products. Take vitamin and mineral supplements as recommended by your health care provider. Do not drink alcohol if: Your health care provider tells you not to drink. You are pregnant, may be pregnant, or are planning to become pregnant. If you drink alcohol: Limit how much you have to 0-1 drink a day. Know how much alcohol is in your drink. In the [place] one drink equals one 12 oz bottle of beer (355 mL), one 5 oz glass of wine (148 mL), or one 1 oz glass of hard liquor (44 mL). Lifestyle Brush your teeth every morning and night with fluoride toothpaste. Floss one time each day. Exercise for at least 30 minutes 5 or more days each week. Do not use any products that contain nicotine or tobacco. These products include cigarettes, chewing tobacco, and vaping devices, such as e-cigarettes. If you need help quitting, ask your health care provider. Do not use drugs. If you are sexually active, practice safe sex. Use a condom or other form of protection to   prevent STIs. If you do not wish to become pregnant, use a form of birth control. If you plan to become pregnant, see your health care provider for a  prepregnancy visit. Take aspirin only as told by your health care provider. Make sure that you understand how much to take and what form to take. Work with your health care provider to find out whether it is safe and beneficial for you to take aspirin daily. Find healthy ways to manage stress, such as: Meditation, yoga, or listening to music. Journaling. Talking to a trusted person. Spending time with friends and family. Minimize exposure to UV radiation to reduce your risk of skin cancer. Safety Always wear your seat belt while driving or riding in a vehicle. Do not drive: If you have been drinking alcohol. Do not ride with someone who has been drinking. When you are tired or distracted. While texting. If you have been using any mind-altering substances or drugs. Wear a helmet and other protective equipment during sports activities. If you have firearms in your house, make sure you follow all gun safety procedures. Seek help if you have been physically or sexually abused. What's next? Visit your health care provider once a year for an annual wellness visit. Ask your health care provider how often you should have your eyes and teeth checked. Stay up to date on all vaccines. This information is not intended to replace advice given to you by your health care provider. Make sure you discuss any questions you have with your health care provider. Document Revised: 06/16/2020 Document Reviewed: 06/16/2020 Elsevier Patient Education  2023 Else[employer] Inc[employer]  

## 2022-02-16 NOTE — Progress Notes (Signed)
Margaret Best - 62 y.o. female MRN [id number]  Date of birth: 1960-12-06  Subjective Chief Complaint  Patient presents with   Annual Exam    HPI Margaret Best is a 62 y.o. female here today for annual exam.   She reports that she is doing pretty well.  She is on anastrazole for history of breast cancer.   Feels pretty good with current medications for HTN.   She is moderately active.  She feels like diet is pretty good.   She is UTD on immunizations and age appropriate vaccinations.   Review of Systems  Constitutional:  Negative for chills, fever, malaise/fatigue and weight loss.  HENT:  Negative for congestion, ear pain and sore throat.   Eyes:  Negative for blurred vision, double vision and pain.  Respiratory:  Negative for cough and shortness of breath.   Cardiovascular:  Negative for chest pain and palpitations.  Gastrointestinal:  Negative for abdominal pain, blood in stool, constipation, heartburn and nausea.  Genitourinary:  Negative for dysuria and urgency.  Musculoskeletal:  Negative for joint pain and myalgias.  Neurological:  Negative for dizziness and headaches.  Endo/Heme/Allergies:  Does not bruise/bleed easily.  Psychiatric/Behavioral:  Negative for depression. The patient is not nervous/anxious and does not have insomnia.        No Known Allergies  Past Medical History:  Diagnosis Date   Ectopic pregnancy    left   Hormone disorder    HYPOTHYROIDISM   Hypertension    Missed ab    FIRST TRIMESTER   Personal history of radiation therapy    Thyroid disease    Toxemia     Past Surgical History:  Procedure Laterality Date   BREAST LUMPECTOMY Left 02/06/2020   re-excision a week later   CESAREAN SECTION     DILATION AND CURETTAGE OF UTERUS     ENTEROLYSIS  1988   SBO   SALPINGECTOMY  1987   left   TUBAL LIGATION      Social History   Socioeconomic History   Marital status: Divorced    Spouse name: Not on file   Number of  children: 2   Years of education: Not on file   Highest education level: Not on file  Occupational History   Not on file  Tobacco Use   Smoking status: Never   Smokeless tobacco: Never  Vaping Use   Vaping Use: Never used  Substance and Sexual Activity   Alcohol use: Yes    Alcohol/week: 5.0 standard drinks of alcohol    Types: 5 Glasses of wine per week   Drug use: No   Sexual activity: Not Currently    Comment: 1st intercourse 62 yo-More than 5 partners  Other Topics Concern   Not on file  Social History Narrative   Not on file   Social Determinants of Health   Financial Resource Strain: Not on file  Food Insecurity: Not on file  Transportation Needs: Not on file  Physical Activity: Not on file  Stress: Not on file  Social Connections: Not on file    Family History  Problem Relation Age of Onset   Hypertension Mother    Stroke Mother    Heart failure Mother    Diabetes Father     Health Maintenance  Topic Date Due   COVID-19 Vaccine (5 - 2023-24 season) 03/04/2022 (Originally 09/02/2021)   PAP SMEAR-Modifier  03/27/2022 (Originally 07/08/2018)   INFLUENZA VACCINE  04/02/2022 (Originally 08/02/2021)   Zoster Vaccines-  Shingrix (1 of 2) 05/17/2022 (Originally 01/07/1979)   COLONOSCOPY (Pts 45-44yr Insurance coverage will need to be confirmed)  06/07/2022 (Originally 01/06/2005)   Hepatitis C Screening  06/07/2022 (Originally 01/06/1978)   HIV Screening  06/07/2022 (Originally 01/07/1975)   MAMMOGRAM  01/10/2024   DTaP/Tdap/Td (2 - Td or Tdap) 01/06/2025   HPV VACCINES  Aged Out     ----------------------------------------------------------------------------------------------------------------------------------------------------------------------------------------------------------------- Physical Exam BP 135/84 (BP Location: Right Arm, Patient Position: Sitting, Cuff Size: Normal)   Pulse (!) 102   Ht 5' (1.524 m)   Wt 176 lb 1.9 oz (79.9 kg)   LMP 01/20/2012   SpO2  96%   BMI 34.40 kg/m   Physical Exam Constitutional:      General: She is not in acute distress. HENT:     Head: Normocephalic and atraumatic.     Right Ear: Tympanic membrane and ear canal normal.     Left Ear: Tympanic membrane and ear canal normal.     Nose: Nose normal.  Eyes:     General: No scleral icterus.    Conjunctiva/sclera: Conjunctivae normal.  Neck:     Thyroid: No thyromegaly.  Cardiovascular:     Rate and Rhythm: Normal rate and regular rhythm.     Heart sounds: Normal heart sounds.  Pulmonary:     Effort: Pulmonary effort is normal.     Breath sounds: Normal breath sounds.  Abdominal:     General: Bowel sounds are normal. There is no distension.     Palpations: Abdomen is soft.     Tenderness: There is no abdominal tenderness. There is no guarding.  Musculoskeletal:        General: Normal range of motion.     Cervical back: Normal range of motion and neck supple.  Lymphadenopathy:     Cervical: No cervical adenopathy.  Skin:    General: Skin is warm and dry.     Findings: No rash.  Neurological:     General: No focal deficit present.     Mental Status: She is alert and oriented to person, place, and time.     Cranial Nerves: No cranial nerve deficit.     Coordination: Coordination normal.  Psychiatric:        Mood and Affect: Mood normal.        Behavior: Behavior normal.     ------------------------------------------------------------------------------------------------------------------------------------------------------------------------------------------------------------------- Assessment and Plan  Well adult exam Well adult Orders Placed This Encounter  Procedures   COMPLETE METABOLIC PANEL WITH GFR   CBC with Differential   Lipid Panel w/reflex Direct LDL   TSH  Screenings: per lab orders Immunizations: UTD Anticipatory guidance/Risk factor reduction:  Recommendations per AVS.    No orders of the defined types were placed in this  encounter.   No follow-ups on file.    This visit occurred during the SARS-CoV-2 public health emergency.  Safety protocols were in place, including screening questions prior to the visit, additional usage of staff PPE, and extensive cleaning of exam room while observing appropriate contact time as indicated for disinfecting solutions.

## 2022-02-23 DIAGNOSIS — M79672 Pain in left foot: Secondary | ICD-10-CM | POA: Diagnosis not present

## 2022-03-07 ENCOUNTER — Other Ambulatory Visit: Payer: Self-pay

## 2022-03-07 ENCOUNTER — Inpatient Hospital Stay: Payer: BC Managed Care – PPO | Attending: Hematology | Admitting: Genetic Counselor

## 2022-03-07 ENCOUNTER — Inpatient Hospital Stay: Payer: BC Managed Care – PPO

## 2022-03-07 ENCOUNTER — Other Ambulatory Visit: Payer: Self-pay | Admitting: Genetic Counselor

## 2022-03-07 ENCOUNTER — Encounter: Payer: Self-pay | Admitting: Genetic Counselor

## 2022-03-07 DIAGNOSIS — Z170 Estrogen receptor positive status [ER+]: Secondary | ICD-10-CM

## 2022-03-07 DIAGNOSIS — Z803 Family history of malignant neoplasm of breast: Secondary | ICD-10-CM | POA: Diagnosis not present

## 2022-03-07 DIAGNOSIS — Z1379 Encounter for other screening for genetic and chromosomal anomalies: Secondary | ICD-10-CM

## 2022-03-07 DIAGNOSIS — Z8481 Family history of carrier of genetic disease: Secondary | ICD-10-CM | POA: Diagnosis not present

## 2022-03-07 DIAGNOSIS — Z1501 Genetic susceptibility to malignant neoplasm of breast: Secondary | ICD-10-CM | POA: Insufficient documentation

## 2022-03-07 DIAGNOSIS — C50412 Malignant neoplasm of upper-outer quadrant of left female breast: Secondary | ICD-10-CM | POA: Diagnosis not present

## 2022-03-07 DIAGNOSIS — Z853 Personal history of malignant neoplasm of breast: Secondary | ICD-10-CM | POA: Diagnosis not present

## 2022-03-07 LAB — CMP (CANCER CENTER ONLY)
ALT: 32 U/L (ref 0–44)
AST: 37 U/L (ref 15–41)
Albumin: 4.4 g/dL (ref 3.5–5.0)
Alkaline Phosphatase: 66 U/L (ref 38–126)
Anion gap: 8 (ref 5–15)
BUN: 16 mg/dL (ref 8–23)
CO2: 30 mmol/L (ref 22–32)
Calcium: 10 mg/dL (ref 8.9–10.3)
Chloride: 103 mmol/L (ref 98–111)
Creatinine: 0.82 mg/dL (ref 0.44–1.00)
GFR, Estimated: >60 mL/min (ref >60)
Glucose, Bld: 125 mg/dL — ABNORMAL HIGH (ref 70–99)
Potassium: 4 mmol/L (ref 3.5–5.1)
Sodium: 141 mmol/L (ref 135–145)
Total Bilirubin: 0.4 mg/dL (ref 0.3–1.2)
Total Protein: 8 g/dL (ref 6.5–8.1)

## 2022-03-07 LAB — CBC WITH DIFFERENTIAL (CANCER CENTER ONLY)
Abs Immature Granulocytes: 0.01 10*3/uL (ref 0.00–0.07)
Basophils Absolute: 0 10*3/uL (ref 0.0–0.1)
Basophils Relative: 1 %
Eosinophils Absolute: 0 10*3/uL (ref 0.0–0.5)
Eosinophils Relative: 1 %
HCT: 42.5 % (ref 36.0–46.0)
Hemoglobin: 14.9 g/dL (ref 12.0–15.0)
Immature Granulocytes: 0 %
Lymphocytes Relative: 21 %
Lymphs Abs: 1.3 10*3/uL (ref 0.7–4.0)
MCH: 32.7 pg (ref 26.0–34.0)
MCHC: 35.1 g/dL (ref 30.0–36.0)
MCV: 93.4 fL (ref 80.0–100.0)
Monocytes Absolute: 0.6 10*3/uL (ref 0.1–1.0)
Monocytes Relative: 10 %
Neutro Abs: 4 10*3/uL (ref 1.7–7.7)
Neutrophils Relative %: 67 %
Platelet Count: 248 10*3/uL (ref 150–400)
RBC: 4.55 MIL/uL (ref 3.87–5.11)
RDW: 12.8 % (ref 11.5–15.5)
WBC Count: 5.9 10*3/uL (ref 4.0–10.5)
nRBC: 0 % (ref 0.0–0.2)

## 2022-03-07 LAB — GENETIC SCREENING ORDER

## 2022-03-07 NOTE — Progress Notes (Unsigned)
REFERRING PROVIDER: [name], [address] [place],  [place] [address]  PRIMARY PROVIDER:  [name], DO  PRIMARY REASON FOR VISIT:  1. Carcinoma of upper-outer quadrant of left breast in female, estrogen receptor positive ([place])   2. Family history of breast cancer   3. Family history of BRCA2 gene positive     HISTORY OF PRESENT ILLNESS:   Margaret Best, a 62 y.o. female, was seen for a [place] cancer genetics consultation at the request of Dr. Zigmund Daniel due to a personal and family history of cancer.  Margaret Best presents to clinic today to discuss the possibility of a hereditary predisposition to cancer, to discuss genetic testing, and to further clarify her future cancer risks, as well as potential cancer risks for family members.   In February 2022, at the age of 62, Margaret Best was diagnosed with invasive ductal carcinoma of the left breast (ER+ / PR+ / HER2-). ****   CANCER HISTORY:  Oncology History Overview Note  Cancer Staging Carcinoma of upper-outer quadrant of left breast in female, estrogen receptor positive ([place]) Staging form: Breast, AJCC 8th Edition - Pathologic stage from 03/08/2020: Stage IA (pT1b, pN0, cM0, G1, ER+, PR+, HER2-) - Signed by [name], MD on 03/08/2020 Stage prefix: Initial diagnosis Histologic grading system: 3 grade system    Carcinoma of upper-outer quadrant of left breast in female, estrogen receptor positive ([place])  12/08/2019 Breast US   Left breast US 12/08/19 Findings:  At the left 12:00 position 7cmfn there is a small mass 1.2x1x0.7cm with spiculated margins and associated calcifications.    01/25/2020 Breast MRI   MRI Breast 01/25/20 Impression Left bresat with lobular enhanceing mass 11:00 position, 6.3cm from nipple, measuring 0.8x1.4x1cm. This is suspicious.  There is linear ductal enhanement extending posteriorly 1.1cm and 2 areas of linear non-mass enhancent extending exteriorly measuring 0.8 and  3.1cm in length. There may represent insitue cancer.    01/28/2020 Initial Biopsy   OUTSIDE SCAN   02/06/2020 Surgery   Left Lumpectomy by Dr Regan Rakers     02/12/2020 Surgery   Left breast re-excision surgery by Dr Regan Rakers     03/08/2020 Initial Diagnosis   Carcinoma of upper-outer quadrant of left breast in female, estrogen receptor positive ([place])   03/08/2020 Cancer Staging   Staging form: Breast, AJCC 8th Edition - Pathologic stage from 03/08/2020: Stage IA (pT1b, pN0, cM0, G1, ER+, PR+, HER2-) - Signed by [name], MD on 03/08/2020 Stage prefix: Initial diagnosis Histologic grading system: 3 grade system   03/25/2020 - 04/21/2020 Radiation Therapy   Adjuvant radiation with Dr Isidore Moos    05/2020 -  Anti-estrogen oral therapy   Anastrozole '1mg'$  daily starting in 05/2020   08/17/2020 Survivorship   SCP delivered via virtual encounter by Cira Rue, NP      RISK FACTORS:  Ovaries intact: yes.  Uterus intact: no.  Menopausal status: postmenopausal.  Mammogram within the last year: yes.  Past Medical History:  Diagnosis Date   Ectopic pregnancy    left   Hormone disorder    HYPOTHYROIDISM   Hypertension    Missed ab    FIRST TRIMESTER   Personal history of radiation therapy    Thyroid disease    Toxemia     Past Surgical History:  Procedure Laterality Date   BREAST LUMPECTOMY Left 02/06/2020   re-excision a week later   [place]  ENTEROLYSIS  1988   [facility]   SALPINGECTOMY  1987   left   TUBAL LIGATION      Social History   Socioeconomic History   Marital status: Divorced    Spouse name: Not on file   Number of children: 2   Years of education: Not on file   Highest education level: Not on file  Occupational History   Not on file  Tobacco Use   Smoking status: Never   Smokeless tobacco: Never  Vaping Use   Vaping Use: Never used  Substance and Sexual Activity   Alcohol use: Yes    Alcohol/week: 5.0  standard drinks of alcohol    Types: 5 Glasses of wine per week   Drug use: No   Sexual activity: Not Currently    Comment: 1st intercourse 62 yo-More than 5 partners  Other Topics Concern   Not on file  Social History Narrative   Not on file   Social Determinants of Health   Financial Resource Strain: Not on file  Food Insecurity: Not on file  Transportation Needs: Not on file  Physical Activity: Not on file  Stress: Not on file  Social Connections: Not on file     FAMILY HISTORY:  We obtained a detailed, 4-generation family history.  Significant diagnoses are listed below: Sibling (deceased, age 66) with heart defect. Family history includes other family members with heart problems (mother, sister).  Niece with breast cancer at age 9 and BRCA2+ gene testing  Family History  Problem Relation Age of Onset   Hypertension Mother    Stroke Mother    Heart failure Mother    Diabetes Father    Breast cancer Niece        BRCA2+       Patient's maternal ancestors are of [place] descent, and paternal ancestors are of [place] descent. There is no reported Ashkenazi Jewish ancestry. There is no known consanguinity.  GENETIC COUNSELING ASSESSMENT: Margaret Best is a 62 y.o. female with a personal and family history of cancer which is somewhat suggestive of a hereditary predisposition to cancer given her niece is BRCA2 gene positive and was diagnosed with breast cancer at age 15. We, therefore, discussed and recommended the following at today's visit.   DISCUSSION: We discussed that 5 - 10% of cancer is hereditary, with most cases of breast cancer associated with BRCA2.  There are other genes that can be associated with hereditary breast cancer syndromes.  We discussed that testing is beneficial for several reasons including knowing how to follow individuals after completing their treatment, identifying whether potential treatment options would be beneficial, and understanding if other  family members could be at risk for cancer and allowing them to undergo genetic testing.   We reviewed the characteristics, features and inheritance patterns of hereditary cancer syndromes. We also discussed genetic testing, including the appropriate family members to test, the process of testing, insurance coverage and turn-around-time for results. We discussed the implications of a negative, positive, carrier and/or variant of uncertain significant result. We recommended Margaret Best pursue genetic testing for a panel that includes genes associated with breast cancer.   Margaret Best was offered a common hereditary cancer panel (48 genes) and an expanded pan-cancer panel (70 genes). Margaret Best was informed of the benefits and limitations of each panel, including that expanded pan-cancer panels contain genes that do not have clear management guidelines at this point in time.  We also discussed that as the number of genes  included on a panel increases, the chances of variants of uncertain significance increases. After considering the benefits and limitations of each gene panel, Margaret Best elected to have an expanded pan-cancer panel.  The Multi-Cancer + RNA Panel offered by [facility] includes sequencing and/or deletion/duplication analysis of the following 70 genes:  AIP*, ALK, APC*, ATM*, AXIN2*, BAP1*, BARD1*, BLM*, BMPR1A*, BRCA1*, BRCA2*, BRIP1*, CDC73*, CDH1*, CDK4, CDKN1B*, CDKN2A, CHEK2*, CTNNA1*, DICER1*, EPCAM (del/dup only), EGFR, FH*, FLCN*, GREM1 (promoter dup only), HOXB13, KIT, LZTR1, MAX*, MBD4, MEN1*, MET, MITF, MLH1*, MSH2*, MSH3*, MSH6*, MUTYH*, NF1*, NF2*, NTHL1*, PALB2*, PDGFRA, PMS2*, POLD1*, POLE*, POT1*, PRKAR1A*, PTCH1*, PTEN*, RAD51C*, RAD51D*, RB1*, RET, SDHA* (sequencing only), SDHAF2*, SDHB*, SDHC*, SDHD*, SMAD4*, SMARCA4*, SMARCB1*, SMARCE1*, STK11*, SUFU*, TMEM127*, TP53*, TSC1*, TSC2*, VHL*. RNA analysis is performed for * genes.  Based on Margaret Best's personal and family history  of cancer, she meets medical criteria for genetic testing. Despite that she meets criteria, she may still have an out of pocket cost. We discussed that if her out of pocket cost for testing is over $100, the laboratory will call and confirm whether she wants to proceed with testing.  If the out of pocket cost of testing is less than $100 she will be billed by the genetic testing laboratory.   PLAN: After considering the risks, benefits, and limitations, Margaret Best provided informed consent to pursue genetic testing and the blood sample was sent to [facility] for analysis of the Multi-Cancer Panel. Results should be available within approximately 2-3 weeks' time, at which point they will be disclosed by telephone to Margaret Best, as will any additional recommendations warranted by these results. Margaret Best will receive a summary of her genetic counseling visit and a copy of her results once available. This information will also be available in Epic.   Margaret Best questions were answered to her satisfaction today. Our contact information was provided should additional questions or concerns arise. Thank you for the referral and allowing [place] to share in the care of your patient.   Lucille Passy, MS, [facility] Genetic Counselor [email or web address] (P) [phone]  The patient was seen for a total of *** minutes in face-to-face genetic counseling.  ***The patient brought ***.  ***The patient was seen alone.  Drs. Lindi Adie and/or Burr Medico were available to discuss this case as needed.   _______________________________________________________________________ For Office Staff:  Number of people involved in session: *** Was an Intern/ student involved with case: {YES/NO:63}

## 2022-03-23 DIAGNOSIS — M79672 Pain in left foot: Secondary | ICD-10-CM | POA: Diagnosis not present

## 2022-03-24 ENCOUNTER — Encounter: Payer: Self-pay | Admitting: Genetic Counselor

## 2022-03-24 ENCOUNTER — Telehealth: Payer: Self-pay | Admitting: Genetic Counselor

## 2022-03-24 DIAGNOSIS — Z1379 Encounter for other screening for genetic and chromosomal anomalies: Secondary | ICD-10-CM | POA: Insufficient documentation

## 2022-03-24 NOTE — Telephone Encounter (Signed)
I contacted Ms. Plouffe to discuss her genetic testing results. No pathogenic variants were identified in the 70 genes analyzed. Detailed clinic note to follow.  The test report has been scanned into EPIC and is located under the Molecular Pathology section of the Results Review tab.  A portion of the result report is included below for reference.   Lucille Passy, MS, [facility] Genetic Counselor [email or web address] (P) [phone]

## 2022-03-27 ENCOUNTER — Encounter: Payer: Self-pay | Admitting: Genetic Counselor

## 2022-03-27 ENCOUNTER — Ambulatory Visit: Payer: Self-pay | Admitting: Genetic Counselor

## 2022-03-27 DIAGNOSIS — Z1379 Encounter for other screening for genetic and chromosomal anomalies: Secondary | ICD-10-CM

## 2022-03-27 NOTE — Progress Notes (Signed)
HPI:   Ms. Drace was previously seen in the [place] clinic due to a personal and family history of cancer and concerns regarding a hereditary predisposition to cancer. Please refer to our prior cancer genetics clinic note for more information regarding our discussion, assessment and recommendations, at the time. Ms. Cass recent genetic test results were disclosed to her, as were recommendations warranted by these results. These results and recommendations are discussed in more detail below.  CANCER HISTORY:  Oncology History Overview Note  Cancer Staging Carcinoma of upper-outer quadrant of left breast in female, estrogen receptor positive ([place]) Staging form: Breast, AJCC 8th Edition - Pathologic stage from 03/08/2020: Stage IA (pT1b, pN0, cM0, G1, ER+, PR+, HER2-) - Signed by [name], MD on 03/08/2020 Stage prefix: Initial diagnosis Histologic grading system: 3 grade system    Carcinoma of upper-outer quadrant of left breast in female, estrogen receptor positive ([place])  12/08/2019 Breast US   Left breast US 12/08/19 Findings:  At the left 12:00 position 7cmfn there is a small mass 1.2x1x0.7cm with spiculated margins and associated calcifications.    01/25/2020 Breast MRI   MRI Breast 01/25/20 Impression Left bresat with lobular enhanceing mass 11:00 position, 6.3cm from nipple, measuring 0.8x1.4x1cm. This is suspicious.  There is linear ductal enhanement extending posteriorly 1.1cm and 2 areas of linear non-mass enhancent extending exteriorly measuring 0.8 and 3.1cm in length. There may represent insitue cancer.    01/28/2020 Initial Biopsy   OUTSIDE SCAN   02/06/2020 Surgery   Left Lumpectomy by Dr Regan Rakers     02/12/2020 Surgery   Left breast re-excision surgery by Dr Regan Rakers     03/08/2020 Initial Diagnosis   Carcinoma of upper-outer quadrant of left breast in female, estrogen receptor positive ([place])   03/08/2020 Cancer Staging   Staging form: Breast,  AJCC 8th Edition - Pathologic stage from 03/08/2020: Stage IA (pT1b, pN0, cM0, G1, ER+, PR+, HER2-) - Signed by [name], MD on 03/08/2020 Stage prefix: Initial diagnosis Histologic grading system: 3 grade system   03/25/2020 - 04/21/2020 Radiation Therapy   Adjuvant radiation with Dr Isidore Moos    05/2020 -  Anti-estrogen oral therapy   Anastrozole 1mg  daily starting in 05/2020   08/17/2020 Survivorship   SCP delivered via virtual encounter by Cira Rue, NP    Genetic Testing   [facility] Multi-Cancer Panel+RNA was Negative. Report date is 03/16/2022.  The Multi-Cancer + RNA Panel offered by [facility] includes sequencing and/or deletion/duplication analysis of the following 70 genes:  AIP*, ALK, APC*, ATM*, AXIN2*, BAP1*, BARD1*, BLM*, BMPR1A*, BRCA1*, BRCA2*, BRIP1*, CDC73*, CDH1*, CDK4, CDKN1B*, CDKN2A, CHEK2*, CTNNA1*, DICER1*, EPCAM (del/dup only), EGFR, FH*, FLCN*, GREM1 (promoter dup only), HOXB13, KIT, LZTR1, MAX*, MBD4, MEN1*, MET, MITF, MLH1*, MSH2*, MSH3*, MSH6*, MUTYH*, NF1*, NF2*, NTHL1*, PALB2*, PDGFRA, PMS2*, POLD1*, POLE*, POT1*, PRKAR1A*, PTCH1*, PTEN*, RAD51C*, RAD51D*, RB1*, RET, SDHA* (sequencing only), SDHAF2*, SDHB*, SDHC*, SDHD*, SMAD4*, SMARCA4*, SMARCB1*, SMARCE1*, STK11*, SUFU*, TMEM127*, TP53*, TSC1*, TSC2*, VHL*. RNA analysis is performed for * genes.     FAMILY HISTORY:  We obtained a detailed, 4-generation family history.  Significant diagnoses are listed below:   Ms. Mcginniss niece was diagnosed with breast cancer at age 52 and had positive BRCA2 gene testing ([id number]). Patient's maternal ancestors are of [place] descent, and paternal ancestors are of [place] descent. There is no reported Ashkenazi Jewish ancestry. There is no known consanguinity.          Family History  Problem Relation Age of Onset  Hypertension Mother     Stroke Mother     Heart failure Mother     Diabetes Father     Breast cancer Niece          BRCA2+              GENETIC TEST  RESULTS:  The [facility] Multi-Cancer Panel found no pathogenic mutations. She does NOT carry the BRCA2 mutation identified in her niece.   The Multi-Cancer + RNA Panel offered by [facility] includes sequencing and/or deletion/duplication analysis of the following 70 genes:  AIP*, ALK, APC*, ATM*, AXIN2*, BAP1*, BARD1*, BLM*, BMPR1A*, BRCA1*, BRCA2*, BRIP1*, CDC73*, CDH1*, CDK4, CDKN1B*, CDKN2A, CHEK2*, CTNNA1*, DICER1*, EPCAM (del/dup only), EGFR, FH*, FLCN*, GREM1 (promoter dup only), HOXB13, KIT, LZTR1, MAX*, MBD4, MEN1*, MET, MITF, MLH1*, MSH2*, MSH3*, MSH6*, MUTYH*, NF1*, NF2*, NTHL1*, PALB2*, PDGFRA, PMS2*, POLD1*, POLE*, POT1*, PRKAR1A*, PTCH1*, PTEN*, RAD51C*, RAD51D*, RB1*, RET, SDHA* (sequencing only), SDHAF2*, SDHB*, SDHC*, SDHD*, SMAD4*, SMARCA4*, SMARCB1*, SMARCE1*, STK11*, SUFU*, TMEM127*, TP53*, TSC1*, TSC2*, VHL*. RNA analysis is performed for * genes.   The test report has been scanned into EPIC and is located under the Molecular Pathology section of the Results Review tab.  A portion of the result report is included below for reference. Genetic testing reported out on 03/16/2022.      Even though a pathogenic variant was not identified, possible explanations for her personal history of cancer may include: Her personal history of cancer may be due to other genetic or environmental factors. There may be a gene mutation in one of these genes that current testing methods cannot detect, but that chance is small. There could be another gene that has not yet been discovered, or that we have not yet tested, that is responsible for the cancer diagnoses in the family.   Therefore, it is important to remain in touch with cancer genetics in the future so that we can continue to offer Ms. Umbach the most up to date genetic testing.   ADDITIONAL GENETIC TESTING:  We discussed with Ms. Massie that her genetic testing was fairly extensive.  If there are genes identified to increase cancer risk that  can be analyzed in the future, we would be happy to discuss and coordinate this testing at that time.    CANCER SCREENING RECOMMENDATIONS:  Ms. Lafontant test result is considered negative (normal).  This means that we have not identified a hereditary cause for her personal and family history of cancer at this time. Most cancers happen by chance and this negative test suggests that her cancer may fall into this category.    An individual's cancer risk and medical management are not determined by genetic test results alone. Overall cancer risk assessment incorporates additional factors, including personal medical history, family history, and any available genetic information that may result in a personalized plan for cancer prevention and surveillance. Therefore, it is recommended she continue to follow the cancer management and screening guidelines provided by her oncology and primary healthcare provider.  RECOMMENDATIONS FOR FAMILY MEMBERS:   Since she did not inherit a mutation in a cancer predisposition gene included on this panel, her children could not have inherited a mutation from her in one of these genes. Individuals in this family might be at some increased risk of developing cancer, over the general population risk, due to the family history of cancer. We recommend women in this family have a yearly mammogram beginning at age 64, or 31 years younger than the earliest onset of  cancer, an annual clinical breast exam, and perform monthly breast self-exams.  FOLLOW-UP:  Cancer genetics is a rapidly advancing field and it is possible that new genetic tests will be appropriate for her and/or her family members in the future. We encouraged her to remain in contact with cancer genetics on an annual basis so we can update her personal and family histories and let her know of advances in cancer genetics that may benefit this family.   Our contact number was provided. Ms. Funck questions were  answered to her satisfaction, and she knows she is welcome to call us at anytime with additional questions or concerns.   Lucille Passy, MS, [facility] Genetic Counselor [email or web address] (P) [phone]

## 2022-04-24 ENCOUNTER — Other Ambulatory Visit: Payer: Self-pay | Admitting: Family Medicine

## 2022-05-03 ENCOUNTER — Other Ambulatory Visit: Payer: Self-pay | Admitting: Family Medicine

## 2022-06-12 ENCOUNTER — Ambulatory Visit: Payer: BC Managed Care – PPO | Admitting: Family Medicine

## 2022-06-12 VITALS — BP 158/100 | HR 90 | Ht 63.0 in | Wt 174.0 lb

## 2022-06-12 DIAGNOSIS — E039 Hypothyroidism, unspecified: Secondary | ICD-10-CM | POA: Diagnosis not present

## 2022-06-12 DIAGNOSIS — I10 Essential (primary) hypertension: Secondary | ICD-10-CM | POA: Diagnosis not present

## 2022-06-12 DIAGNOSIS — Z1211 Encounter for screening for malignant neoplasm of colon: Secondary | ICD-10-CM | POA: Diagnosis not present

## 2022-06-12 MED ORDER — ANASTROZOLE 1 MG PO TABS: ORAL_TABLET | ORAL | 0 refills | Status: DC

## 2022-06-12 MED ORDER — HYDROCHLOROTHIAZIDE 25 MG PO TABS: 25.0000 mg | ORAL_TABLET | Freq: Every day | ORAL | 0 refills | Status: DC

## 2022-06-12 MED ORDER — LEVOTHYROXINE SODIUM 75 MCG PO TABS: 75.0000 ug | ORAL_TABLET | Freq: Every day | ORAL | 0 refills | Status: DC

## 2022-06-12 MED ORDER — AMLODIPINE BESYLATE 5 MG PO TABS: 5.0000 mg | ORAL_TABLET | Freq: Every day | ORAL | 0 refills | Status: DC

## 2022-06-12 MED ORDER — CARVEDILOL 12.5 MG PO TABS: ORAL_TABLET | ORAL | 0 refills | Status: DC

## 2022-06-12 NOTE — Assessment & Plan Note (Signed)
BP elevated today.  Return later this week for BP check.

## 2022-06-12 NOTE — Assessment & Plan Note (Signed)
She will have updated TSH completed later this week.

## 2022-06-12 NOTE — Progress Notes (Signed)
Margaret Best - 62 y.o. female MRN [id number]  Date of birth: 1960-06-26  Subjective Chief Complaint  Patient presents with   Medication Refill    HPI Margaret Best is a 62 y.o. female here today for follow up visit.    She reports that she is doing well.  She is planning on traveling to [place] next week and will be gone for a few weeks.  Her BP is elevated.  Reports some stress regarding getting everything together to travel and that her daughter is "getting on her nerves" today.  Her BP has been better controlled at home.  Denies chest pain, shortness of breath, palpitations, headache or vision changes.   She has not had labs completed from her annual exam in February.  Plans to have this completed later this week when fasting.   ROS:  A comprehensive ROS was completed and negative except as noted per HPI  No Known Allergies  Past Medical History:  Diagnosis Date   Ectopic pregnancy    left   Hormone disorder    HYPOTHYROIDISM   Hypertension    Missed ab    FIRST TRIMESTER   Personal history of radiation therapy    Thyroid disease    Toxemia     Past Surgical History:  Procedure Laterality Date   BREAST LUMPECTOMY Left 02/06/2020   re-excision a week later   CESAREAN SECTION     DILATION AND CURETTAGE OF UTERUS     ENTEROLYSIS  1988   SBO   SALPINGECTOMY  1987   left   TUBAL LIGATION      Social History   Socioeconomic History   Marital status: Divorced    Spouse name: Not on file   Number of children: 2   Years of education: Not on file   Highest education level: Master's degree (e.g., MA, MS, MEng, MEd, MSW, MBA)  Occupational History   Not on file  Tobacco Use   Smoking status: Never   Smokeless tobacco: Never  Vaping Use   Vaping Use: Never used  Substance and Sexual Activity   Alcohol use: Yes    Alcohol/week: 5.0 standard drinks of alcohol    Types: 5 Glasses of wine per week   Drug use: No   Sexual activity: Not Currently     Comment: 1st intercourse 62 yo-More than 5 partners  Other Topics Concern   Not on file  Social History Narrative   Not on file   Social Determinants of Health   Financial Resource Strain: Low Risk  (06/12/2022)   Overall Financial Resource Strain (CARDIA)    Difficulty of Paying Living Expenses: Not hard at all  Food Insecurity: No Food Insecurity (06/12/2022)   Hunger Vital Sign    Worried About Running Out of Food in the Last Year: Never true    Ran Out of Food in the Last Year: Never true  Transportation Needs: No Transportation Needs (06/12/2022)   PRAPARE - Administrator, Civil Service (Medical): No    Lack of Transportation (Non-Medical): No  Physical Activity: Unknown (06/12/2022)   Exercise Vital Sign    Days of Exercise per Week: Patient declined    Minutes of Exercise per Session: Not on file  Stress: No Stress Concern Present (06/12/2022)   [employer] of Occupational Health - Occupational Stress Questionnaire    Feeling of Stress : Not at all  Social Connections: Moderately Isolated (06/12/2022)   Social Connection and Isolation Panel [NHANES]  Frequency of Communication with Friends and Family: More than three times a week    Frequency of Social Gatherings with Friends and Family: More than three times a week    Attends Religious Services: 1 to 4 times per year    Active Member of Clubs or Organizations: No    Attends Engineer, structural: Not on file    Marital Status: Divorced    Family History  Problem Relation Age of Onset   Hypertension Mother    Stroke Mother    Heart failure Mother    Diabetes Father    Breast cancer Niece        BRCA2+    Health Maintenance  Topic Date Due   Fecal DNA (Cologuard)  Never done   COVID-19 Vaccine (5 - 2023-24 season) 06/28/2022 (Originally 09/02/2021)   PAP SMEAR-Modifier  12/03/2022 (Originally 07/08/2018)   Zoster Vaccines- Shingrix (1 of 2) 12/03/2022 (Originally 01/07/1979)   Hepatitis C  Screening  06/12/2023 (Originally 01/06/1978)   HIV Screening  06/12/2023 (Originally 01/07/1975)   INFLUENZA VACCINE  08/03/2022   MAMMOGRAM  01/10/2024   DTaP/Tdap/Td (2 - Td or Tdap) 01/06/2025   HPV VACCINES  Aged Out     ----------------------------------------------------------------------------------------------------------------------------------------------------------------------------------------------------------------- Physical Exam BP (!) 158/100 (BP Location: Right Arm, Patient Position: Sitting, Cuff Size: Normal)   Pulse 90   Ht 5\' 3"  (1.6 m)   Wt 174 lb (78.9 kg)   LMP 01/20/2012   SpO2 97%   BMI 30.82 kg/m   Physical Exam Constitutional:      Appearance: Normal appearance.  HENT:     Head: Normocephalic and atraumatic.  Eyes:     General: No scleral icterus. Cardiovascular:     Rate and Rhythm: Normal rate and regular rhythm.  Pulmonary:     Effort: Pulmonary effort is normal.     Breath sounds: Normal breath sounds.  Musculoskeletal:     Cervical back: Neck supple.  Neurological:     Mental Status: She is alert.  Psychiatric:        Mood and Affect: Mood normal.        Behavior: Behavior normal.     ------------------------------------------------------------------------------------------------------------------------------------------------------------------------------------------------------------------- Assessment and Plan  Hypothyroidism She will have updated TSH completed later this week.   HTN (hypertension) BP elevated today.  Return later this week for BP check.     Meds ordered this encounter  Medications   amLODipine (NORVASC) 5 MG tablet    Sig: Take 1 tablet (5 mg total) by mouth daily.    Dispense:  90 tablet    Refill:  0   anastrozole (ARIMIDEX) 1 MG tablet    Sig: TAKE 1 TABLET(1 MG) BY MOUTH DAILY    Dispense:  90 tablet    Refill:  0   carvedilol (COREG) 12.5 MG tablet    Sig: TAKE 1 TABLET(12.5 MG) BY MOUTH TWICE DAILY  WITH A MEAL    Dispense:  180 tablet    Refill:  0   hydrochlorothiazide (HYDRODIURIL) 25 MG tablet    Sig: Take 1 tablet (25 mg total) by mouth daily.    Dispense:  90 tablet    Refill:  0   levothyroxine (SYNTHROID) 75 MCG tablet    Sig: Take 1 tablet (75 mcg total) by mouth daily.    Dispense:  90 tablet    Refill:  0    Return in about 6 months (around 12/12/2022) for HTN.    This visit occurred during the SARS-CoV-2 public  health emergency.  Safety protocols were in place, including screening questions prior to the visit, additional usage of staff PPE, and extensive cleaning of exam room while observing appropriate contact time as indicated for disinfecting solutions.

## 2022-06-15 ENCOUNTER — Other Ambulatory Visit: Payer: Self-pay | Admitting: Family Medicine

## 2022-06-15 ENCOUNTER — Ambulatory Visit (INDEPENDENT_AMBULATORY_CARE_PROVIDER_SITE_OTHER): Payer: BC Managed Care – PPO | Admitting: Family Medicine

## 2022-06-15 ENCOUNTER — Ambulatory Visit: Payer: BC Managed Care – PPO

## 2022-06-15 VITALS — BP 158/88 | HR 95 | Ht 63.0 in

## 2022-06-15 DIAGNOSIS — Z13220 Encounter for screening for lipoid disorders: Secondary | ICD-10-CM | POA: Diagnosis not present

## 2022-06-15 DIAGNOSIS — I10 Essential (primary) hypertension: Secondary | ICD-10-CM | POA: Diagnosis not present

## 2022-06-15 DIAGNOSIS — E039 Hypothyroidism, unspecified: Secondary | ICD-10-CM | POA: Diagnosis not present

## 2022-06-15 MED ORDER — AMLODIPINE BESYLATE 10 MG PO TABS: 10.0000 mg | ORAL_TABLET | Freq: Every day | ORAL | 1 refills | Status: DC

## 2022-06-15 NOTE — Progress Notes (Signed)
Medical screening examination/treatment was performed by qualified clinical staff member and as supervising physician I was immediately available for consultation/collaboration. I have reviewed documentation and agree with assessment and plan.  Kyley Laurel, DO  

## 2022-06-15 NOTE — Progress Notes (Signed)
   Established Patient Office Visit  Subjective   Patient ID: Margaret Best, female    DOB: [birth year]  Age: 62 y.o. MRN: [id number]  Chief Complaint  Patient presents with   Hypertension    BP check nurse visit.     HPI  Hypertension- nurse visit BP check. Patient denies chest pain, shortness of breath, dizzinesss, palpitations or problems with medication.   ROS    Objective:     BP (!) 158/88   Pulse 95   Ht 5\' 3"  (1.6 m)   LMP 01/20/2012   SpO2 99%   BMI 30.82 kg/m    Physical Exam   No results found for any visits on 06/15/22.    The 10-year ASCVD risk score (Arnett DK, et al., 2019) is: 7.8%    Assessment & Plan:  BP check nurse visit. Iniital reading 170/89 ( patient anticipating blood draw) - BP taken after blood drawn  158/94 and second reading 158/88. Per Dr. Ashley Royalty increase the Amlodipine to 10mg  and return for nurse visit after upcoming trip in 3 weeks.  Problem List Items Addressed This Visit       Cardiovascular and Mediastinum   HTN (hypertension) - Primary    Return in about 3 weeks (around 07/06/2022) for nurse visit for BP check. Elizabeth Palau, LPN

## 2022-06-15 NOTE — Patient Instructions (Signed)
Return in 3 weeks for nurse visit for BP check.

## 2022-06-16 LAB — COMPLETE METABOLIC PANEL WITH GFR
AG Ratio: 1.4 (calc) (ref 1.0–2.5)
ALT: 26 U/L (ref 6–29)
AST: 28 U/L (ref 10–35)
Albumin: 4.3 g/dL (ref 3.6–5.1)
Alkaline phosphatase (APISO): 74 U/L (ref 37–153)
BUN: 11 mg/dL (ref 7–25)
CO2: 28 mmol/L (ref 20–32)
Calcium: 10 mg/dL (ref 8.6–10.4)
Chloride: 101 mmol/L (ref 98–110)
Creat: 0.77 mg/dL (ref 0.50–1.05)
Globulin: 3.1 g/dL (calc) (ref 1.9–3.7)
Glucose, Bld: 119 mg/dL — ABNORMAL HIGH (ref 65–99)
Potassium: 4.5 mmol/L (ref 3.5–5.3)
Sodium: 140 mmol/L (ref 135–146)
Total Bilirubin: 0.5 mg/dL (ref 0.2–1.2)
Total Protein: 7.4 g/dL (ref 6.1–8.1)
eGFR: 87 mL/min/{1.73_m2} (ref >=60)

## 2022-06-16 LAB — CBC WITH DIFFERENTIAL/PLATELET
Absolute Monocytes: 432 cells/uL (ref 200–950)
Basophils Absolute: 29 cells/uL (ref 0–200)
Basophils Relative: 0.6 %
Eosinophils Absolute: 48 cells/uL (ref 15–500)
Eosinophils Relative: 1 %
HCT: 44.5 % (ref 35.0–45.0)
Hemoglobin: 15 g/dL (ref 11.7–15.5)
Lymphs Abs: 1296 cells/uL (ref 850–3900)
MCH: 32.5 pg (ref 27.0–33.0)
MCHC: 33.7 g/dL (ref 32.0–36.0)
MCV: 96.5 fL (ref 80.0–100.0)
MPV: 13.2 fL — ABNORMAL HIGH (ref 7.5–12.5)
Monocytes Relative: 9 %
Neutro Abs: 2995 cells/uL (ref 1500–7800)
Neutrophils Relative %: 62.4 %
Platelets: 237 10*3/uL (ref 140–400)
RBC: 4.61 10*6/uL (ref 3.80–5.10)
RDW: 11.8 % (ref 11.0–15.0)
Total Lymphocyte: 27 %
WBC: 4.8 10*3/uL (ref 3.8–10.8)

## 2022-06-16 LAB — LIPID PANEL W/REFLEX DIRECT LDL
Cholesterol: 190 mg/dL (ref <200)
HDL: 72 mg/dL (ref >=50)
LDL Cholesterol (Calc): 97 mg/dL (calc)
Non-HDL Cholesterol (Calc): 118 mg/dL (calc) (ref <130)
Total CHOL/HDL Ratio: 2.6 (calc) (ref <5.0)
Triglycerides: 114 mg/dL (ref <150)

## 2022-06-16 LAB — TSH: TSH: 1.99 mIU/L (ref 0.40–4.50)

## 2022-07-09 NOTE — Progress Notes (Unsigned)
Patient Care Team: Everrett Coombe, DO as PCP - General (Family Medicine) Malachy Mood, MD as Consulting Physician (Hematology) Lonie Peak, MD as Attending Physician (Radiation Oncology) Pollyann Samples, NP as Nurse Practitioner (Nurse Practitioner)   CHIEF COMPLAINT: Follow up left breast cancer   Oncology History Overview Note  Cancer Staging Carcinoma of upper-outer quadrant of left breast in female, estrogen receptor positive [facility]) Staging form: Breast, AJCC 8th Edition - Pathologic stage from 03/08/2020: Stage IA (pT1b, pN0, cM0, G1, ER+, PR+, HER2-) - Signed by Lonie Peak, MD on 03/08/2020 Stage prefix: Initial diagnosis Histologic grading system: 3 grade system    Carcinoma of upper-outer quadrant of left breast in female, estrogen receptor positive (HCC)  12/08/2019 Breast US   Left breast US 12/08/19 Findings:  At the left 12:00 position 7cmfn there is a small mass 1.2x1x0.7cm with spiculated margins and associated calcifications.    01/25/2020 Breast MRI   MRI Breast 01/25/20 Impression Left bresat with lobular enhanceing mass 11:00 position, 6.3cm from nipple, measuring 0.8x1.4x1cm. This is suspicious.  There is linear ductal enhanement extending posteriorly 1.1cm and 2 areas of linear non-mass enhancent extending exteriorly measuring 0.8 and 3.1cm in length. There may represent insitue cancer.    01/28/2020 Initial Biopsy   OUTSIDE SCAN   02/06/2020 Surgery   Left Lumpectomy by Dr Shawn Route     02/12/2020 Surgery   Left breast re-excision surgery by Dr Shawn Route     03/08/2020 Initial Diagnosis   Carcinoma of upper-outer quadrant of left breast in female, estrogen receptor positive (HCC)   03/08/2020 Cancer Staging   Staging form: Breast, AJCC 8th Edition - Pathologic stage from 03/08/2020: Stage IA (pT1b, pN0, cM0, G1, ER+, PR+, HER2-) - Signed by Lonie Peak, MD on 03/08/2020 Stage prefix: Initial diagnosis Histologic grading system: 3 grade system   03/25/2020 -  04/21/2020 Radiation Therapy   Adjuvant radiation with Dr Basilio Cairo    05/2020 -  Anti-estrogen oral therapy   Anastrozole 1mg  daily starting in 05/2020   08/17/2020 Survivorship   SCP delivered via virtual encounter by Santiago Glad, NP    Genetic Testing   [facility] Multi-Cancer Panel+RNA was Negative. Report date is 03/16/2022.  The Multi-Cancer + RNA Panel offered by [facility] includes sequencing and/or deletion/duplication analysis of the following 70 genes:  AIP*, ALK, APC*, ATM*, AXIN2*, BAP1*, BARD1*, BLM*, BMPR1A*, BRCA1*, BRCA2*, BRIP1*, CDC73*, CDH1*, CDK4, CDKN1B*, CDKN2A, CHEK2*, CTNNA1*, DICER1*, EPCAM (del/dup only), EGFR, FH*, FLCN*, GREM1 (promoter dup only), HOXB13, KIT, LZTR1, MAX*, MBD4, MEN1*, MET, MITF, MLH1*, MSH2*, MSH3*, MSH6*, MUTYH*, NF1*, NF2*, NTHL1*, PALB2*, PDGFRA, PMS2*, POLD1*, POLE*, POT1*, PRKAR1A*, PTCH1*, PTEN*, RAD51C*, RAD51D*, RB1*, RET, SDHA* (sequencing only), SDHAF2*, SDHB*, SDHC*, SDHD*, SMAD4*, SMARCA4*, SMARCB1*, SMARCE1*, STK11*, SUFU*, TMEM127*, TP53*, TSC1*, TSC2*, VHL*. RNA analysis is performed for * genes.      CURRENT THERAPY: Anastrozole, starting 05/2020  INTERVAL HISTORY Ms. Cerbone returns for follow up as scheduled. Last seen by Dr. Mosetta Putt 01/11/22. Continues anastrozole. Overdue for mammogram/DEXA.   ROS   Past Medical History:  Diagnosis Date   Ectopic pregnancy    left   Hormone disorder    HYPOTHYROIDISM   Hypertension    Missed ab    FIRST TRIMESTER   Personal history of radiation therapy    Thyroid disease    Toxemia      Past Surgical History:  Procedure Laterality Date   BREAST LUMPECTOMY Left 02/06/2020   re-excision a week later   CESAREAN SECTION  DILATION AND CURETTAGE OF UTERUS     ENTEROLYSIS  1988   SBO   SALPINGECTOMY  1987   left   TUBAL LIGATION       Outpatient Encounter Medications as of 07/12/2022  Medication Sig   amLODipine (NORVASC) 10 MG tablet Take 1 tablet (10 mg total) by mouth daily.    anastrozole (ARIMIDEX) 1 MG tablet TAKE 1 TABLET(1 MG) BY MOUTH DAILY   carvedilol (COREG) 12.5 MG tablet TAKE 1 TABLET(12.5 MG) BY MOUTH TWICE DAILY WITH A MEAL   hydrochlorothiazide (HYDRODIURIL) 25 MG tablet Take 1 tablet (25 mg total) by mouth daily.   levothyroxine (SYNTHROID) 75 MCG tablet Take 1 tablet (75 mcg total) by mouth daily.   No facility-administered encounter medications on file as of 07/12/2022.     There were no vitals filed for this visit. There is no height or weight on file to calculate BMI.   PHYSICAL EXAM GENERAL:alert, no distress and comfortable SKIN: no rash  EYES: sclera clear NECK: without mass LYMPH:  no palpable cervical or supraclavicular lymphadenopathy  LUNGS: clear with normal breathing effort HEART: regular rate & rhythm, no lower extremity edema ABDOMEN: abdomen soft, non-tender and normal bowel sounds NEURO: alert & oriented x 3 with fluent speech, no focal motor/sensory deficits Breast exam:  PAC without erythema    CBC    Component Value Date/Time   WBC 4.8 06/15/2022 0817   RBC 4.61 06/15/2022 0817   HGB 15.0 06/15/2022 0817   HGB 14.9 03/07/2022 0946   HCT 44.5 06/15/2022 0817   PLT 237 06/15/2022 0817   PLT 248 03/07/2022 0946   MCV 96.5 06/15/2022 0817   MCH 32.5 06/15/2022 0817   MCHC 33.7 06/15/2022 0817   RDW 11.8 06/15/2022 0817   LYMPHSABS 1,296 06/15/2022 0817   MONOABS 0.6 03/07/2022 0946   EOSABS 48 06/15/2022 0817   BASOSABS 29 06/15/2022 0817     CMP     Component Value Date/Time   NA 140 06/15/2022 0817   K 4.5 06/15/2022 0817   CL 101 06/15/2022 0817   CO2 28 06/15/2022 0817   GLUCOSE 119 (H) 06/15/2022 0817   BUN 11 06/15/2022 0817   CREATININE 0.77 06/15/2022 0817   CALCIUM 10.0 06/15/2022 0817   PROT 7.4 06/15/2022 0817   ALBUMIN 4.4 03/07/2022 0946   AST 28 06/15/2022 0817   AST 37 03/07/2022 0946   ALT 26 06/15/2022 0817   ALT 32 03/07/2022 0946   ALKPHOS 66 03/07/2022 0946   BILITOT 0.5  06/15/2022 0817   BILITOT 0.4 03/07/2022 0946   GFRNONAA >60 03/07/2022 0946   GFRAA >60 12/18/2017 1518     ASSESSMENT & PLAN:Ashantia Steve is a 62 y.o. female with    1. Left breast cancer, Stage IA, p(T1bN0M0), ER+/PR+/HER2-, Grade I  -Diagnosed in 01/2020. S/p left lumpectomy by Dr Delanna Ahmadi in [place] on 02/06/20 and re-excision on 02/12/20 for close margin and hematoma evacuation. Surgical path showed 0.9 mm invasive ductal carcinoma, grade 1, post ER and PR positive, HER-2 negative, with negative margins and negative lymph nodes. She overall has low risk of recurrence, oncotype not recommended.  -She received adjuvant radiation with Dr Basilio Cairo 03/25/20-04/21/20 -She began anastrozole in 05/2020, tolerating well with mild hot flashes/night sweats. -Mammograms show stable likely benign calcifications   2. Elevated AST/ALT -She has had mild intermittent transaminitis since at least 11/08/2020 with ALT of 49. Exam is benign. Pt think she may have fatty liver? -I ordered abdominal US  after I saw her last year but was never done.  -Mild, intermittent; recently normal   3. Bone health  -baseline DEXA on 05/19/20 was normal (T-score of -0.9) -she understands AIs can lower bone density -She does not take calcium due to high calcium in 03/2021, she takes vitamin D supplement. I encouraged her to increase weight bearing exercise such as walking.  -Repeat DEXA 5/24 overdue    4. HTN, hypothyroidism -continue med regimen, f/up PCP    PLAN:  No orders of the defined types were placed in this encounter.     All questions were answered. The patient knows to call the clinic with any problems, questions or concerns. No barriers to learning were detected. I spent *** counseling the patient face to face. The total time spent in the appointment was *** and more than 50% was on counseling, review of test results, and coordination of care.   Santiago Glad, NP-C @DATE @

## 2022-07-12 ENCOUNTER — Inpatient Hospital Stay: Payer: BC Managed Care – PPO | Attending: Nurse Practitioner | Admitting: Nurse Practitioner

## 2022-07-12 ENCOUNTER — Encounter: Payer: Self-pay | Admitting: Nurse Practitioner

## 2022-07-12 ENCOUNTER — Other Ambulatory Visit: Payer: Self-pay

## 2022-07-12 ENCOUNTER — Other Ambulatory Visit: Payer: BC Managed Care – PPO

## 2022-07-12 VITALS — BP 138/90 | HR 102 | Temp 97.7°F | Resp 17 | Wt 172.1 lb

## 2022-07-12 DIAGNOSIS — Z79811 Long term (current) use of aromatase inhibitors: Secondary | ICD-10-CM | POA: Diagnosis not present

## 2022-07-12 DIAGNOSIS — Z170 Estrogen receptor positive status [ER+]: Secondary | ICD-10-CM | POA: Insufficient documentation

## 2022-07-12 DIAGNOSIS — C50412 Malignant neoplasm of upper-outer quadrant of left female breast: Secondary | ICD-10-CM | POA: Diagnosis not present

## 2022-07-13 ENCOUNTER — Ambulatory Visit: Payer: BC Managed Care – PPO

## 2022-07-20 DIAGNOSIS — M79672 Pain in left foot: Secondary | ICD-10-CM | POA: Diagnosis not present

## 2022-07-21 ENCOUNTER — Other Ambulatory Visit: Payer: Self-pay | Admitting: Family Medicine

## 2022-07-25 NOTE — Telephone Encounter (Signed)
Left message advising of recommendations.  

## 2022-07-31 ENCOUNTER — Ambulatory Visit (INDEPENDENT_AMBULATORY_CARE_PROVIDER_SITE_OTHER): Payer: BC Managed Care – PPO | Admitting: Family Medicine

## 2022-07-31 VITALS — BP 134/80 | HR 85 | Ht 63.0 in

## 2022-07-31 DIAGNOSIS — I10 Essential (primary) hypertension: Secondary | ICD-10-CM

## 2022-07-31 NOTE — Progress Notes (Signed)
   Established Patient Office Visit  Subjective   Patient ID: Margaret Best, female    DOB: 05-Oct-1960  Age: 62 y.o. MRN: [id number]  Chief Complaint  Patient presents with   Hypertension    BP check nurse visit.    HPI  Primary hypertension. Nurse visit BP check. Patient denies chest pain , dizziness, palpitations or problems with medication.  She has cologuard kit at home but has not yet return it.  She declines shingles vaccine.  She will find out where last PAP was done  - was 2 years ago and was normal but unsure where was done she will look into this for [place].    ROS    Objective:     BP 136/78   Pulse 85   Ht 5\' 3"  (1.6 m)   LMP 01/20/2012   SpO2 99%   BMI 30.49 kg/m    Physical Exam   No results found for any visits on 07/31/22.    The 10-year ASCVD risk score (Arnett DK, et al., 2019) is: 5.1%    Assessment & Plan:  Initial reading = 136/78(manual reading ) second reading 134/80 ( manual reading- after sitting in quiet room for 5 minutes). Per Dr. Ashley Royalty continue current medication regimen and keep upcoming appt scheduled for Dec 12, 2022 @ 2:50pm  Problem List Items Addressed This Visit   None   No follow-ups on file.    Elizabeth Palau, LPN

## 2022-07-31 NOTE — Patient Instructions (Addendum)
Keep upcoming appt schld for  12/12/2022 @ 2:50pm -continue current medication regimen.

## 2022-07-31 NOTE — Progress Notes (Signed)
Medical screening examination/treatment was performed by qualified clinical staff member and as supervising physician I was immediately available for consultation/collaboration. I have reviewed documentation and agree with assessment and plan.  Cody Matthews, DO  

## 2022-08-17 DIAGNOSIS — M79672 Pain in left foot: Secondary | ICD-10-CM | POA: Diagnosis not present

## 2022-08-23 ENCOUNTER — Other Ambulatory Visit: Payer: Self-pay

## 2022-08-23 DIAGNOSIS — E039 Hypothyroidism, unspecified: Secondary | ICD-10-CM

## 2022-08-23 MED ORDER — LEVOTHYROXINE SODIUM 75 MCG PO TABS
75.0000 ug | ORAL_TABLET | Freq: Every day | ORAL | 0 refills | Status: DC
Start: 2022-08-23 — End: 2022-12-12

## 2022-09-14 ENCOUNTER — Other Ambulatory Visit: Payer: Self-pay | Admitting: Family Medicine

## 2022-09-14 DIAGNOSIS — I10 Essential (primary) hypertension: Secondary | ICD-10-CM

## 2022-12-12 ENCOUNTER — Ambulatory Visit: Payer: BC Managed Care – PPO | Admitting: Family Medicine

## 2022-12-12 ENCOUNTER — Encounter: Payer: Self-pay | Admitting: Family Medicine

## 2022-12-12 VITALS — BP 134/82 | HR 100 | Ht 63.0 in | Wt 176.0 lb

## 2022-12-12 DIAGNOSIS — I10 Essential (primary) hypertension: Secondary | ICD-10-CM

## 2022-12-12 DIAGNOSIS — E039 Hypothyroidism, unspecified: Secondary | ICD-10-CM

## 2022-12-12 DIAGNOSIS — R7309 Other abnormal glucose: Secondary | ICD-10-CM | POA: Diagnosis not present

## 2022-12-12 LAB — POCT GLYCOSYLATED HEMOGLOBIN (HGB A1C): Hemoglobin A1C: 6.1 % — AB (ref 4.0–5.6)

## 2022-12-12 MED ORDER — LEVOTHYROXINE SODIUM 75 MCG PO TABS: 75.0000 ug | ORAL_TABLET | Freq: Every day | ORAL | 1 refills | Status: DC

## 2022-12-12 MED ORDER — HYDROCHLOROTHIAZIDE 25 MG PO TABS: 25.0000 mg | ORAL_TABLET | Freq: Every day | ORAL | 1 refills | Status: DC

## 2022-12-12 MED ORDER — AMLODIPINE BESYLATE 5 MG PO TABS: 5.0000 mg | ORAL_TABLET | Freq: Every day | ORAL | 1 refills | Status: DC

## 2022-12-12 MED ORDER — CARVEDILOL 12.5 MG PO TABS: ORAL_TABLET | ORAL | 1 refills | Status: DC

## 2022-12-12 NOTE — Progress Notes (Signed)
Margaret Best - 62 y.o. female MRN [id number]  Date of birth: 02/03/1960  Subjective Chief Complaint  Patient presents with   Hypertension    HPI Margaret Best is a 62 y.o. female here today for follow up visit.   She reports that she is doing well..   She remains on amlodipine, coreg, hydrochlorothiazide for management of HTN.  BP is well controlled with current medications.  She has not had chest pain, shortness of breath, palpitations, headache or vision changes.    Feels well with current strength of levothyroxine.   ROS:  A comprehensive ROS was completed and negative except as noted per HPI  No Known Allergies  Past Medical History:  Diagnosis Date   Ectopic pregnancy    left   Hormone disorder    HYPOTHYROIDISM   Hypertension    Missed ab    FIRST TRIMESTER   Personal history of radiation therapy    Thyroid disease    Toxemia     Past Surgical History:  Procedure Laterality Date   BREAST LUMPECTOMY Left 02/06/2020   re-excision a week later   CESAREAN SECTION     DILATION AND CURETTAGE OF UTERUS     ENTEROLYSIS  1988   SBO   SALPINGECTOMY  1987   left   TUBAL LIGATION      Social History   Socioeconomic History   Marital status: Divorced    Spouse name: Not on file   Number of children: 2   Years of education: Not on file   Highest education level: Master's degree (e.g., MA, MS, MEng, MEd, MSW, MBA)  Occupational History   Not on file  Tobacco Use   Smoking status: Never   Smokeless tobacco: Never  Vaping Use   Vaping status: Never Used  Substance and Sexual Activity   Alcohol use: Yes    Alcohol/week: 5.0 standard drinks of alcohol    Types: 5 Glasses of wine per week   Drug use: No   Sexual activity: Not Currently    Comment: 1st intercourse 62 yo-More than 5 partners  Other Topics Concern   Not on file  Social History Narrative   Not on file   Social Determinants of Health   Financial Resource Strain: Low Risk   (06/12/2022)   Overall Financial Resource Strain (CARDIA)    Difficulty of Paying Living Expenses: Not hard at all  Food Insecurity: No Food Insecurity (06/12/2022)   Hunger Vital Sign    Worried About Running Out of Food in the Last Year: Never true    Ran Out of Food in the Last Year: Never true  Transportation Needs: No Transportation Needs (06/12/2022)   PRAPARE - Administrator, Civil Service (Medical): No    Lack of Transportation (Non-Medical): No  Physical Activity: Unknown (06/12/2022)   Exercise Vital Sign    Days of Exercise per Week: Patient declined    Minutes of Exercise per Session: Not on file  Stress: No Stress Concern Present (06/12/2022)   [employer] of Occupational Health - Occupational Stress Questionnaire    Feeling of Stress : Not at all  Social Connections: Moderately Isolated (06/12/2022)   Social Connection and Isolation Panel [NHANES]    Frequency of Communication with Friends and Family: More than three times a week    Frequency of Social Gatherings with Friends and Family: More than three times a week    Attends Religious Services: 1 to 4 times per year  Active Member of Clubs or Organizations: No    Attends Engineer, structural: Not on file    Marital Status: Divorced    Family History  Problem Relation Age of Onset   Hypertension Mother    Stroke Mother    Heart failure Mother    Diabetes Father    Breast cancer Niece        BRCA2+    Health Maintenance  Topic Date Due   Zoster Vaccines- Shingrix (1 of 2) Never done   Fecal DNA (Cologuard)  Never done   Cervical Cancer Screening (HPV/Pap Cotest)  07/07/2020   COVID-19 Vaccine (5 - 2023-24 season) 09/03/2022   INFLUENZA VACCINE  04/02/2023 (Originally 08/03/2022)   Hepatitis C Screening  06/12/2023 (Originally 01/06/1978)   HIV Screening  06/12/2023 (Originally 01/07/1975)   MAMMOGRAM  01/10/2024   DTaP/Tdap/Td (2 - Td or Tdap) 01/06/2025   HPV VACCINES  Aged Out      ----------------------------------------------------------------------------------------------------------------------------------------------------------------------------------------------------------------- Physical Exam BP 134/82 (BP Location: Right Arm, Patient Position: Sitting, Cuff Size: Normal)   Pulse 100   Ht 5\' 3"  (1.6 m)   Wt 176 lb (79.8 kg)   LMP 01/20/2012   SpO2 96%   BMI 31.18 kg/m   Physical Exam Constitutional:      Appearance: Normal appearance.  HENT:     Head: Normocephalic and atraumatic.  Eyes:     General: No scleral icterus. Cardiovascular:     Rate and Rhythm: Normal rate and regular rhythm.  Pulmonary:     Effort: Pulmonary effort is normal.     Breath sounds: Normal breath sounds.  Musculoskeletal:     Cervical back: Neck supple.  Neurological:     Mental Status: She is alert.  Psychiatric:        Mood and Affect: Mood normal.        Behavior: Behavior normal.     ------------------------------------------------------------------------------------------------------------------------------------------------------------------------------------------------------------------- Assessment and Plan  Hypothyroidism Doing well with current strength of levothyroxine.  Will plan to continue.  HTN (hypertension) Blood pressure is well-controlled with current medications.  Recommend continuation.   Meds ordered this encounter  Medications   amLODipine (NORVASC) 5 MG tablet    Sig: Take 1 tablet (5 mg total) by mouth daily.    Dispense:  90 tablet    Refill:  1   carvedilol (COREG) 12.5 MG tablet    Sig: TAKE 1 TABLET(12.5 MG) BY MOUTH TWICE DAILY WITH A MEAL    Dispense:  180 tablet    Refill:  1   hydrochlorothiazide (HYDRODIURIL) 25 MG tablet    Sig: Take 1 tablet (25 mg total) by mouth daily.    Dispense:  90 tablet    Refill:  1   levothyroxine (SYNTHROID) 75 MCG tablet    Sig: Take 1 tablet (75 mcg total) by mouth daily.     Dispense:  90 tablet    Refill:  1    Return in about 6 months (around 06/12/2023) for Hypertension/thyroid/fasting labs.    This visit occurred during the SARS-CoV-2 public health emergency.  Safety protocols were in place, including screening questions prior to the visit, additional usage of staff PPE, and extensive cleaning of exam room while observing appropriate contact time as indicated for disinfecting solutions.

## 2022-12-12 NOTE — Assessment & Plan Note (Signed)
Doing well with current strength of levothyroxine.  Will plan to continue.

## 2022-12-12 NOTE — Assessment & Plan Note (Signed)
Blood pressure is well-controlled with current medications.  Recommend continuation.

## 2022-12-13 ENCOUNTER — Telehealth: Payer: Self-pay

## 2022-12-13 NOTE — Telephone Encounter (Signed)
Copied from CRM [id number]. Topic: Clinical - Medication Refill >> Dec 08, 2022  3:59 PM Theodis Sato wrote: Most Recent Primary Care Visit:  Provider: Everrett Coombe  Department: [facility]  Visit Type: NURSE VISIT  Date: 07/31/2022  Medication: hydrochlorothiazide (HYDRODIURIL) 25 MG tablet  Has the patient contacted their pharmacy? Yes- PT states the pharmacy sent a fax for re-fill request 2 days ago. (Agent: If no, request that the patient contact the pharmacy for the refill. If patient does not wish to contact the pharmacy document the reason why and proceed with request.) (Agent: If yes, when and what did the pharmacy advise?)  Is this the correct pharmacy for this prescription? Yes If no, delete pharmacy and type the correct one.  This is the patient's preferred pharmacy:  [id number] - [place], [place] - [address] AT [facility] [address] [place] [address] Phone: [phone] Fax: [phone]   Has the prescription been filled recently? Yes  Is the patient out of the medication? Yes  Has the patient been seen for an appointment in the last year OR does the patient have an upcoming appointment? Yes  Can we respond through MyChart?   Agent: Please be advised that Rx refills may take up to 3 business days. We ask that you follow-up with your  pharmacy.      Rx has been sent to the pharmacy 12/12/2022

## 2022-12-14 ENCOUNTER — Other Ambulatory Visit: Payer: Self-pay

## 2022-12-22 DIAGNOSIS — M722 Plantar fascial fibromatosis: Secondary | ICD-10-CM | POA: Diagnosis not present

## 2022-12-29 ENCOUNTER — Other Ambulatory Visit: Payer: Self-pay

## 2023-01-08 NOTE — Progress Notes (Deleted)
 Patient Care Team: Alvia Bring, DO as PCP - General (Family Medicine) Lanny Callander, MD as Consulting Physician (Hematology) Izell Domino, MD as Attending Physician (Radiation Oncology) Burton, Lacie K, NP as Nurse Practitioner (Nurse Practitioner)  Clinic Day:  01/08/2023  Referring physician: Alvia Bring, DO  ASSESSMENT & PLAN:   Assessment & Plan: Carcinoma of upper-outer quadrant of left breast in female, estrogen receptor positive (HCC) Stage IA, p(T1bN0M0), ER+/PR+/HER2-, Grade I  -Diagnosed in 01/2020. S/p left lumpectomy by Dr Alethia in [place]  on 02/06/20 and re-excision on 02/12/20 for close margin and hematoma evacuation. Surgical path showed 0.9 mm invasive ductal carcinoma, grade 1, post ER and PR positive, HER-2 negative, with negative margins and negative lymph nodes. She overall has low risk of recurrence, oncotype not recommended.  -She received adjuvant radiation with Dr Izell 03/25/20-04/21/20 -She began anastrozole  in 05/2020, tolerating well with mild hot flashes/night sweats. -Ms. Konuves is clinically doing well.  Tolerating anastrozole , exam is benign, labs are stable.  Overall no clinical concern for recurrence -Her niece was recently diagnosed with breast cancer, pt's genetic report is negative  -Continue breast cancer surveillance and anastrozole . -Overdue for repeat mammo, she will schedule now -F/up in 6 months, or sooner if needed       The patient understands the plans discussed today and is in agreement with them.  She knows to contact our office if she develops concerns prior to her next appointment.  I provided *** minutes of face-to-face time during this encounter and > 50% was spent counseling as documented under my assessment and plan.    Powell FORBES Lessen, NP  [facility] - A DEPT OF [facility] [address] [place] [address] Dept: [phone] Dept Fax: [phone]   No  orders of the defined types were placed in this encounter.     CHIEF COMPLAINT:  CC: left breast cancer, estrogen receptor positive.   Current Treatment:  anastrozole  starting 05/2020  INTERVAL HISTORY:  Lizvette is here today for repeat clinical assessment. She last saw Lacie, NP on 07/12/2022. Having mild hot flashes at night. Was to schedule diagnostic mammogram after last appointment, along with bone density test. She denies fevers or chills. She denies pain. Her appetite is good. Her weight {Weight change:10426}.  I have reviewed the past medical history, past surgical history, social history and family history with the patient and they are unchanged from previous note.  ALLERGIES:  has no known allergies.  MEDICATIONS:  Current Outpatient Medications  Medication Sig Dispense Refill   amLODipine  (NORVASC ) 5 MG tablet Take 1 tablet (5 mg total) by mouth daily. 90 tablet 1   anastrozole  (ARIMIDEX ) 1 MG tablet TAKE 1 TABLET(1 MG) BY MOUTH DAILY 90 tablet 0   carvedilol  (COREG ) 12.5 MG tablet TAKE 1 TABLET(12.5 MG) BY MOUTH TWICE DAILY WITH A MEAL 180 tablet 1   hydrochlorothiazide  (HYDRODIURIL ) 25 MG tablet Take 1 tablet (25 mg total) by mouth daily. 90 tablet 1   levothyroxine  (SYNTHROID ) 75 MCG tablet Take 1 tablet (75 mcg total) by mouth daily. 90 tablet 1   No current facility-administered medications for this visit.    HISTORY OF PRESENT ILLNESS:   Oncology History Overview Note  Cancer Staging Carcinoma of upper-outer quadrant of left breast in female, estrogen receptor positive (HCC) Staging form: Breast, AJCC 8th Edition - Pathologic stage from 03/08/2020: Stage IA (pT1b, pN0, cM0, G1, ER+, PR+, HER2-) - Signed by  Izell Domino, MD on 03/08/2020 Stage prefix: Initial diagnosis Histologic grading system: 3 grade system    Carcinoma of upper-outer quadrant of left breast in female, estrogen receptor positive (HCC)  12/08/2019 Breast US    Left breast US  12/08/19 Findings:   At the left 12:00 position 7cmfn there is a small mass 1.2x1x0.7cm with spiculated margins and associated calcifications.    01/25/2020 Breast MRI   MRI Breast 01/25/20 Impression Left bresat with lobular enhanceing mass 11:00 position, 6.3cm from nipple, measuring 0.8x1.4x1cm. This is suspicious.  There is linear ductal enhanement extending posteriorly 1.1cm and 2 areas of linear non-mass enhancent extending exteriorly measuring 0.8 and 3.1cm in length. There may represent insitue cancer.    01/28/2020 Initial Biopsy   OUTSIDE SCAN   02/06/2020 Surgery   Left Lumpectomy by Dr Alfred     02/12/2020 Surgery   Left breast re-excision surgery by Dr Alfred     03/08/2020 Initial Diagnosis   Carcinoma of upper-outer quadrant of left breast in female, estrogen receptor positive (HCC)   03/08/2020 Cancer Staging   Staging form: Breast, AJCC 8th Edition - Pathologic stage from 03/08/2020: Stage IA (pT1b, pN0, cM0, G1, ER+, PR+, HER2-) - Signed by Izell Domino, MD on 03/08/2020 Stage prefix: Initial diagnosis Histologic grading system: 3 grade system   03/25/2020 - 04/21/2020 Radiation Therapy   Adjuvant radiation with Dr izell    05/2020 -  Anti-estrogen oral therapy   Anastrozole  1mg  daily starting in 05/2020   08/17/2020 Survivorship   SCP delivered via virtual encounter by Lacie Burton, NP    Genetic Testing   [facility] Multi-Cancer Panel+RNA was Negative. Report date is 03/16/2022.  The Multi-Cancer + RNA Panel offered by [facility] includes sequencing and/or deletion/duplication analysis of the following 70 genes:  AIP*, ALK, APC*, ATM*, AXIN2*, BAP1*, BARD1*, BLM*, BMPR1A*, BRCA1*, BRCA2*, BRIP1*, CDC73*, CDH1*, CDK4, CDKN1B*, CDKN2A, CHEK2*, CTNNA1*, DICER1*, EPCAM (del/dup only), EGFR, FH*, FLCN*, GREM1 (promoter dup only), HOXB13, KIT, LZTR1, MAX*, MBD4, MEN1*, MET, MITF, MLH1*, MSH2*, MSH3*, MSH6*, MUTYH*, NF1*, NF2*, NTHL1*, PALB2*, PDGFRA, PMS2*, POLD1*, POLE*, POT1*, PRKAR1A*, PTCH1*,  PTEN*, RAD51C*, RAD51D*, RB1*, RET, SDHA* (sequencing only), SDHAF2*, SDHB*, SDHC*, SDHD*, SMAD4*, SMARCA4*, SMARCB1*, SMARCE1*, STK11*, SUFU*, TMEM127*, TP53*, TSC1*, TSC2*, VHL*. RNA analysis is performed for * genes.       REVIEW OF SYSTEMS:   Constitutional: Denies fevers, chills or abnormal weight loss Eyes: Denies blurriness of vision Ears, nose, mouth, throat, and face: Denies mucositis or sore throat Respiratory: Denies cough, dyspnea or wheezes Cardiovascular: Denies palpitation, chest discomfort or lower extremity swelling Gastrointestinal:  Denies nausea, heartburn or change in bowel habits Skin: Denies abnormal skin rashes Lymphatics: Denies new lymphadenopathy or easy bruising Neurological:Denies numbness, tingling or new weaknesses Behavioral/Psych: Mood is stable, no new changes  All other systems were reviewed with the patient and are negative.   VITALS:  Last menstrual period 01/20/2012.  Wt Readings from Last 3 Encounters:  12/12/22 176 lb (79.8 kg)  07/12/22 172 lb 1.6 oz (78.1 kg)  06/12/22 174 lb (78.9 kg)    There is no height or weight on file to calculate BMI.  Performance status (ECOG): {CHL ONC [id number]  PHYSICAL EXAM:   GENERAL:alert, no distress and comfortable SKIN: skin color, texture, turgor are normal, no rashes or significant lesions EYES: normal, Conjunctiva are pink and non-injected, sclera clear OROPHARYNX:no exudate, no erythema and lips, buccal mucosa, and tongue normal  NECK: supple, thyroid  normal size, non-tender, without nodularity LYMPH:  no palpable lymphadenopathy in the  cervical, axillary or inguinal LUNGS: clear to auscultation and percussion with normal breathing effort HEART: regular rate & rhythm and no murmurs and no lower extremity edema ABDOMEN:abdomen soft, non-tender and normal bowel sounds Musculoskeletal:no cyanosis of digits and no clubbing  NEURO: alert & oriented x 3 with fluent speech, no focal motor/sensory  deficits  LABORATORY DATA:  I have reviewed the data as listed    Component Value Date/Time   NA 140 06/15/2022 0817   K 4.5 06/15/2022 0817   CL 101 06/15/2022 0817   CO2 28 06/15/2022 0817   GLUCOSE 119 (H) 06/15/2022 0817   BUN 11 06/15/2022 0817   CREATININE 0.77 06/15/2022 0817   CALCIUM 10.0 06/15/2022 0817   PROT 7.4 06/15/2022 0817   ALBUMIN 4.4 03/07/2022 0946   AST 28 06/15/2022 0817   AST 37 03/07/2022 0946   ALT 26 06/15/2022 0817   ALT 32 03/07/2022 0946   ALKPHOS 66 03/07/2022 0946   BILITOT 0.5 06/15/2022 0817   BILITOT 0.4 03/07/2022 0946   GFRNONAA >60 03/07/2022 0946   GFRAA >60 12/18/2017 1518    No results found for: SPEP, UPEP  Lab Results  Component Value Date   WBC 4.8 06/15/2022   NEUTROABS 2,995 06/15/2022   HGB 15.0 06/15/2022   HCT 44.5 06/15/2022   MCV 96.5 06/15/2022   PLT 237 06/15/2022      Chemistry      Component Value Date/Time   NA 140 06/15/2022 0817   K 4.5 06/15/2022 0817   CL 101 06/15/2022 0817   CO2 28 06/15/2022 0817   BUN 11 06/15/2022 0817   CREATININE 0.77 06/15/2022 0817      Component Value Date/Time   CALCIUM 10.0 06/15/2022 0817   ALKPHOS 66 03/07/2022 0946   AST 28 06/15/2022 0817   AST 37 03/07/2022 0946   ALT 26 06/15/2022 0817   ALT 32 03/07/2022 0946   BILITOT 0.5 06/15/2022 0817   BILITOT 0.4 03/07/2022 0946       RADIOGRAPHIC STUDIES: I have personally reviewed the radiological images as listed and agreed with the findings in the report. No results found.

## 2023-01-08 NOTE — Assessment & Plan Note (Deleted)
 Stage IA, p(T1bN0M0), ER+/PR+/HER2-, Grade I  -Diagnosed in 01/2020. S/p left lumpectomy by Dr Alethia in [place]  on 02/06/20 and re-excision on 02/12/20 for close margin and hematoma evacuation. Surgical path showed 0.9 mm invasive ductal carcinoma, grade 1, post ER and PR positive, HER-2 negative, with negative margins and negative lymph nodes. She overall has low risk of recurrence, oncotype not recommended.  -She received adjuvant radiation with Dr Izell 03/25/20-04/21/20 -She began anastrozole  in 05/2020, tolerating well with mild hot flashes/night sweats. -Margaret Best is clinically doing well.  Tolerating anastrozole , exam is benign, labs are stable.  Overall no clinical concern for recurrence -Her niece was recently diagnosed with breast cancer, pt's genetic report is negative  -Continue breast cancer surveillance and anastrozole . -Overdue for repeat mammo, she will schedule now -F/up in 6 months, or sooner if needed

## 2023-01-10 ENCOUNTER — Inpatient Hospital Stay: Payer: BC Managed Care – PPO | Admitting: Hematology

## 2023-01-10 ENCOUNTER — Inpatient Hospital Stay: Payer: BC Managed Care – PPO

## 2023-01-11 ENCOUNTER — Inpatient Hospital Stay: Payer: BC Managed Care – PPO

## 2023-01-11 ENCOUNTER — Inpatient Hospital Stay: Payer: BC Managed Care – PPO | Admitting: Nurse Practitioner

## 2023-01-11 DIAGNOSIS — C50412 Malignant neoplasm of upper-outer quadrant of left female breast: Secondary | ICD-10-CM

## 2023-01-16 ENCOUNTER — Other Ambulatory Visit: Payer: Self-pay | Admitting: Hematology

## 2023-01-16 ENCOUNTER — Other Ambulatory Visit: Payer: Self-pay

## 2023-01-16 DIAGNOSIS — E2839 Other primary ovarian failure: Secondary | ICD-10-CM

## 2023-01-16 DIAGNOSIS — R921 Mammographic calcification found on diagnostic imaging of breast: Secondary | ICD-10-CM

## 2023-01-16 DIAGNOSIS — C50412 Malignant neoplasm of upper-outer quadrant of left female breast: Secondary | ICD-10-CM

## 2023-01-18 ENCOUNTER — Ambulatory Visit
Admission: RE | Admit: 2023-01-18 | Discharge: 2023-01-18 | Disposition: A | Discharge status: Home / Self Care | Payer: BC Managed Care – PPO | Source: Ambulatory Visit | Attending: Hematology | Admitting: Hematology

## 2023-01-18 DIAGNOSIS — N958 Other specified menopausal and perimenopausal disorders: Secondary | ICD-10-CM | POA: Diagnosis not present

## 2023-01-18 DIAGNOSIS — E2839 Other primary ovarian failure: Secondary | ICD-10-CM | POA: Diagnosis not present

## 2023-01-18 DIAGNOSIS — M8588 Other specified disorders of bone density and structure, other site: Secondary | ICD-10-CM | POA: Diagnosis not present

## 2023-01-24 ENCOUNTER — Telehealth: Payer: Self-pay

## 2023-01-24 NOTE — Telephone Encounter (Addendum)
Called patient to relay message below as per Dr.Feng. Patient didn't answer the phone so I left the results on her voicemail. Will also send her the results in her mychart.   ----- Message from Malachy Mood sent at 01/24/2023  7:21 AM EST ----- Please let pt know her DEXA result, and reschedule her appointment (last one on 01/11/2023 was cancelled) with lab this month with Herbert Seta, thx   Malachy Mood

## 2023-01-29 IMAGING — MG DIGITAL DIAGNOSTIC BILAT W/ TOMO W/ CAD
7 of 13 series · 7 of 33 positions shown · non-contrast
Comparison: 02/06/2020, 01/28/2020, 01/25/2020;
COMPARISON: 02/06/2020, 01/28/2020, 01/25/2020;

Addendum:
CLINICAL DATA: History of LEFT lumpectomy with radiation therapy in
Monday February, 2020. Patient takes anastrozole. Prior imaging and
surgery were performed in [place]. Previous MRI and selected
images from LEFT breast are available, performed in [REDACTED] and
February 2020. The remainder of prior studies are not available at
this time.

EXAM:
DIGITAL DIAGNOSTIC BILATERAL MAMMOGRAM WITH TOMOSYNTHESIS AND CAD
TECHNIQUE: Bilateral digital diagnostic mammography and breast tomosynthesis
was performed. The images were evaluated with computer-aided
detection.

[L MLO]
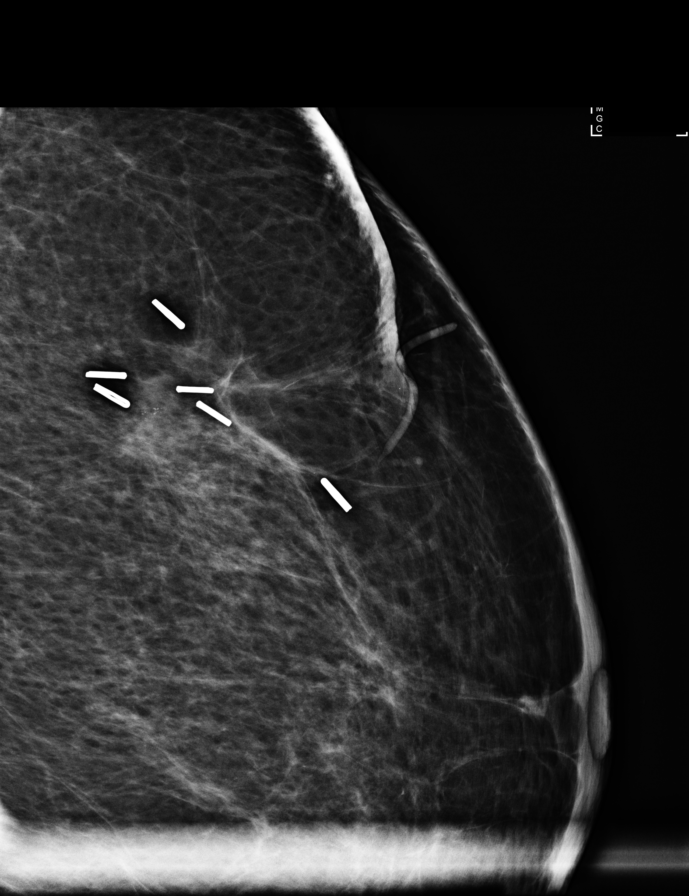

[L CC (1 of 2)]
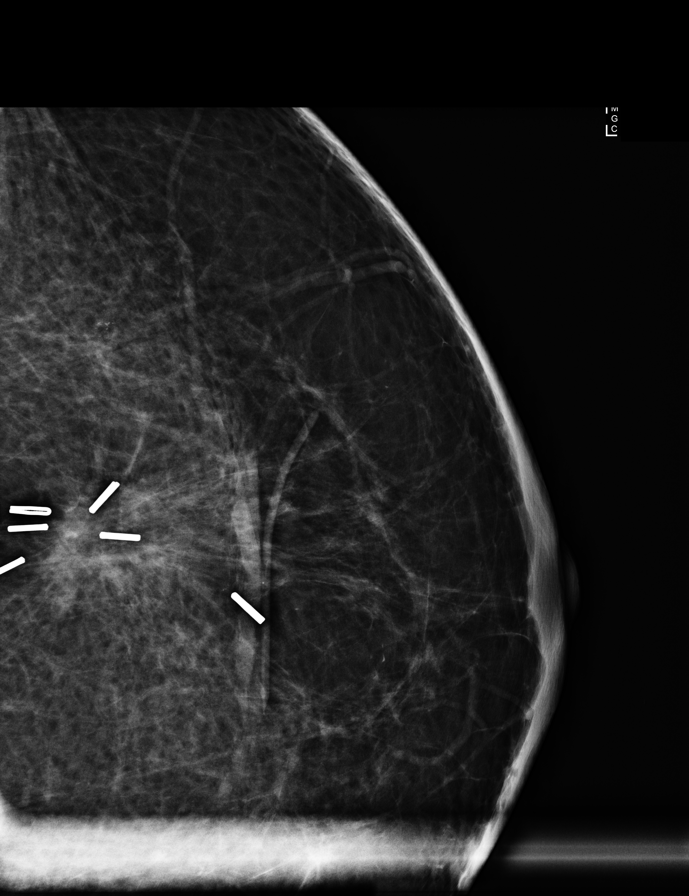

[L CC (2 of 2)]
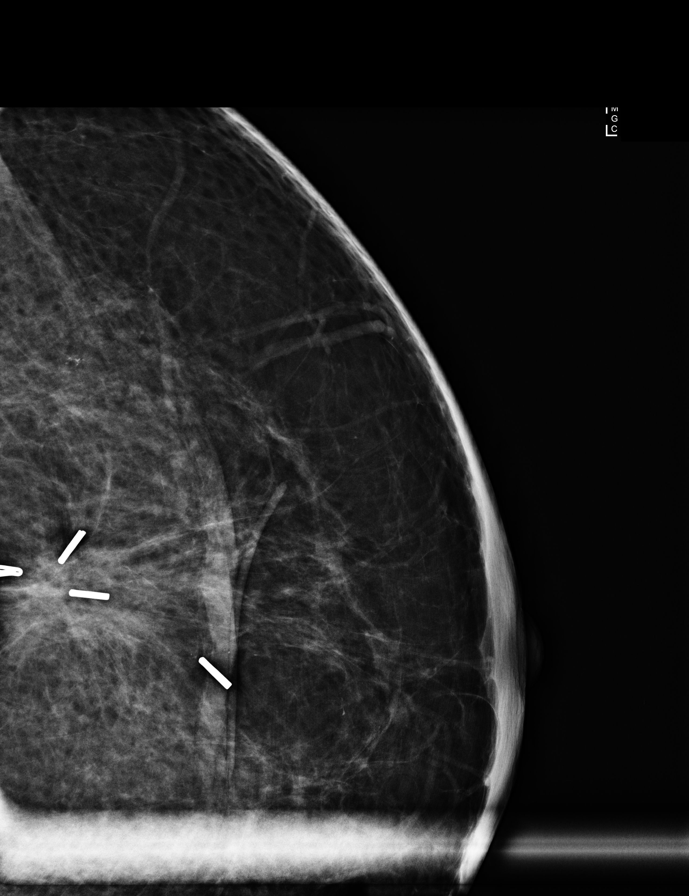

[R CC synth-2D]
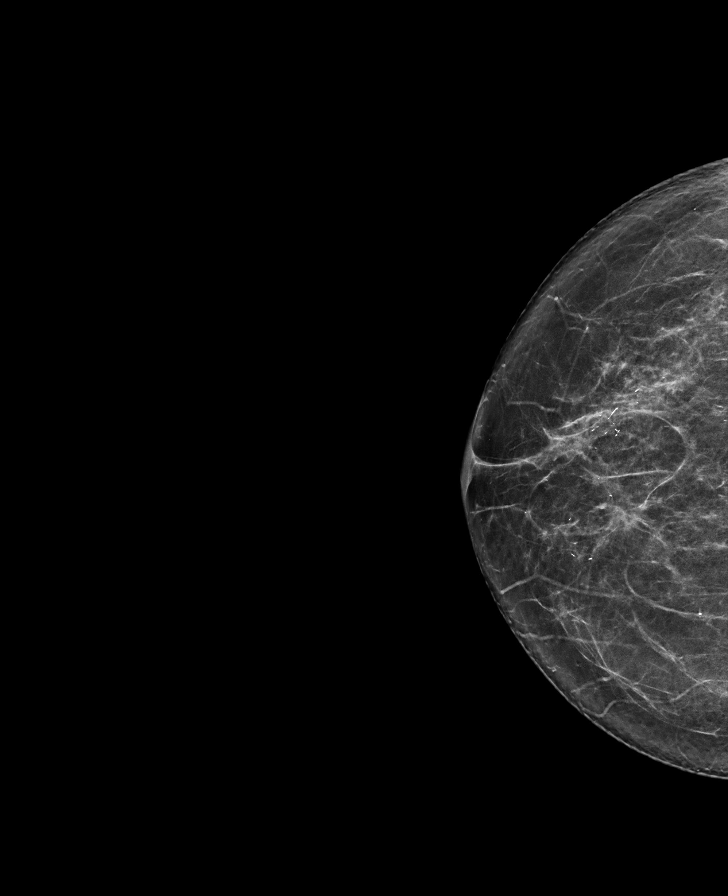

[R MLO synth-2D]
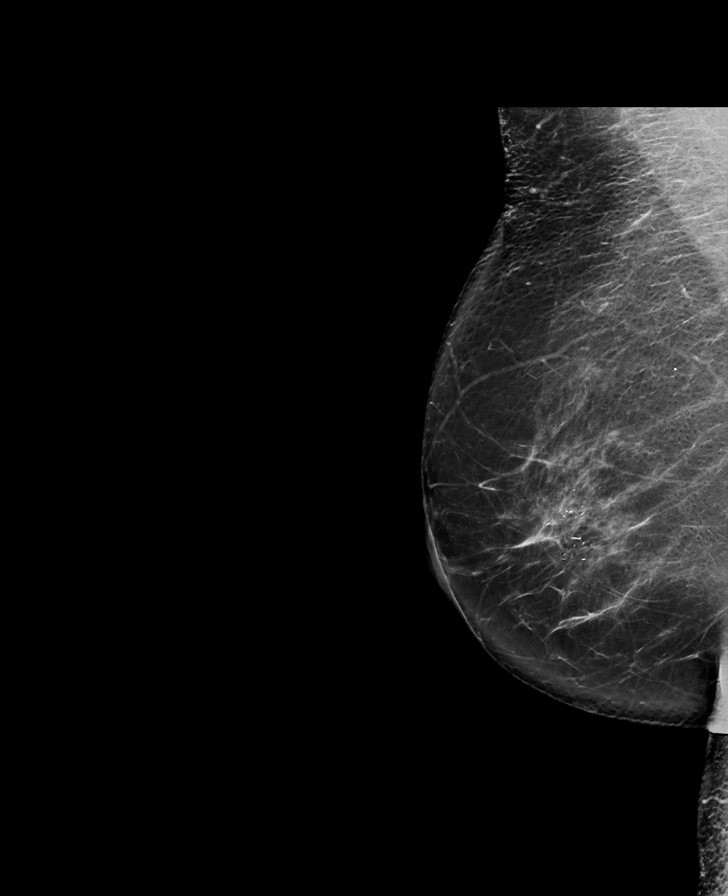

[L CC synth-2D]
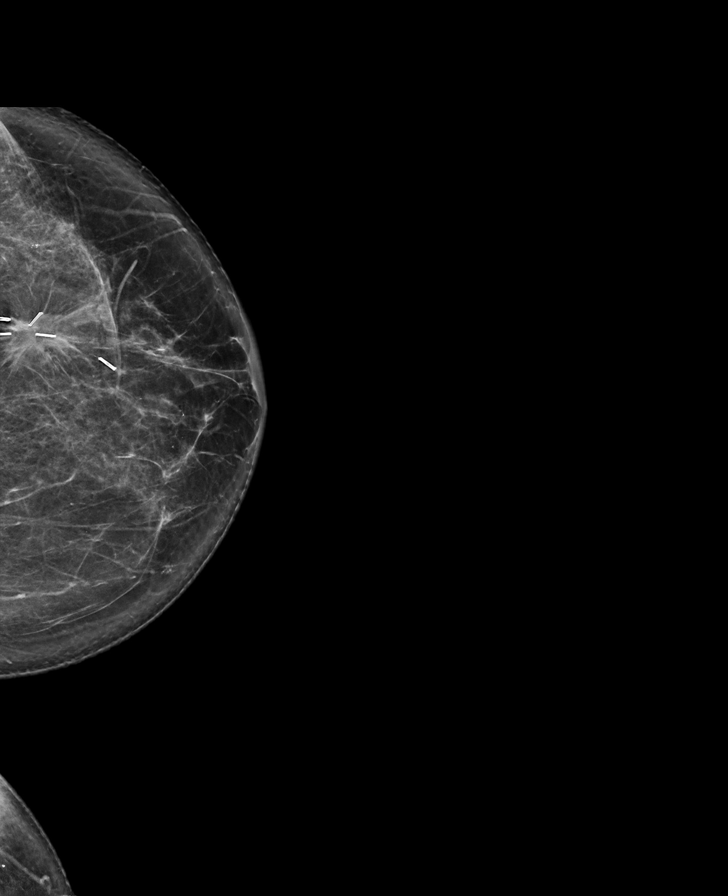

[L ML synth-2D]
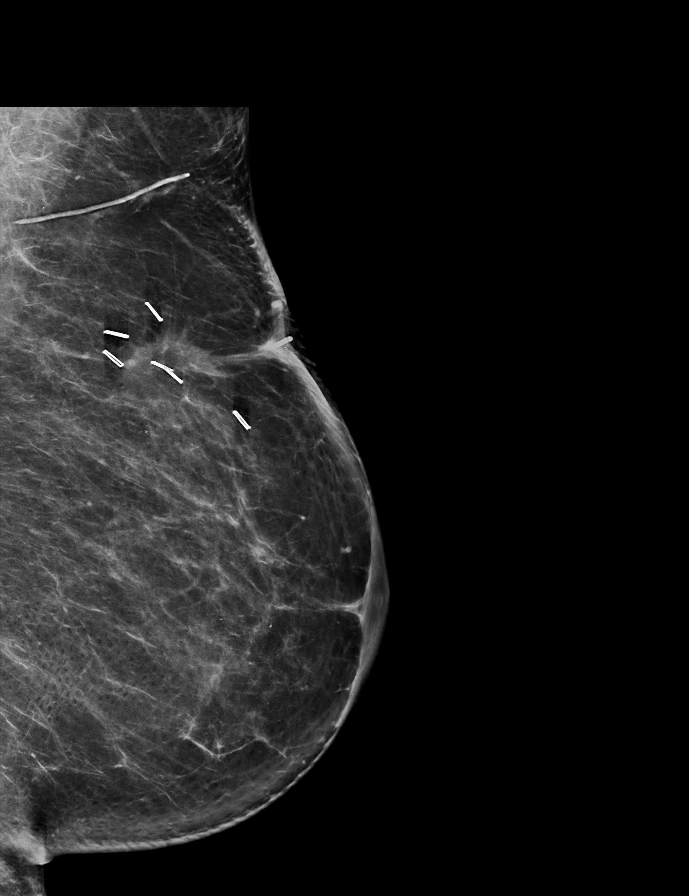

[7 of 33 positions shown; findings below may reference images not displayed]

images from
patient's cancer workup are not yet available for comparison. Remote
images from the [REDACTED] [HOSPITAL] are available, 8330
and [id number].

ACR Breast Density Category b: There are scattered areas of
fibroglandular density.
FINDINGS: RIGHT BREAST:

Mammogram: Loosely grouped calcifications are identified in the
central portion of the RIGHT breast, with appearance favoring
secretory calcifications. Stability is not known. Mammographic
images were processed with CAD.

LEFT BREAST:

Mammogram: Postoperative changes are seen in the UPPER portion of
the LEFT breast. A group of microcalcifications in the LATERAL
portion of the LEFT breast is further evaluated with magnified
views. These views show a 0.3 centimeter group of linear and round
calcifications 3 centimeters LATERAL to the lumpectomy scar.
Stability is not known. Mammographic images were processed with CAD.
IMPRESSION: 1. Calcifications in the central RIGHT breast are favored to be
benign. Comparison prior studies is needed to assess stability.
2. 0.3 centimeter group of indeterminate calcifications in the
LATERAL portion of the LEFT breast. Comparison with prior studies is
needed to assess stability.

RECOMMENDATION:
Recommend comparison with prior studies when available.

I will call the patient with results. We discussed the possibility
of biopsy if prior studies cannot be obtained, or if there has been
a change since prior studies.

I have discussed the findings and recommendations with the patient.
If applicable, a reminder letter will be sent to the patient
regarding the next appointment.

BI-RADS CATEGORY  0: Incomplete. Need additional imaging evaluation
and/or prior mammograms for comparison.

ADDENDUM:
Comparison is now made with prior studies from [REDACTED]
performed 12/08/2019 and earlier.

The 0.3 centimeter group of calcifications in the LEFT breast,
LATERAL to the lumpectomy site, represents a change compared to
prior studies.

The secretory calcifications in the RIGHT breast are stable.
IMPRESSION: 1. Suspicious microcalcifications in the LEFT breast.
2. Stable benign calcifications in the RIGHT breast.

Recommendation:

Stereotactic biopsy of LEFT breast calcifications. I spoke with the
patient regarding the findings and recommendations. If biopsy is not
performed, I would recommend follow-up LEFT diagnostic mammogram in
6 months.

At the request of the patient, I discussed findings and
recommendations with the patient's cousin, Dr. Saadane.

BI-RADS:  4: Suspicious.

*** End of Addendum ***
images from
patient's cancer workup are not yet available for comparison. Remote
images from the [REDACTED] [HOSPITAL] are available, 8330
and 5663.

ACR Breast Density Category b: There are scattered areas of
fibroglandular density.
FINDINGS: RIGHT BREAST:

Mammogram: Loosely grouped calcifications are identified in the
central portion of the RIGHT breast, with appearance favoring
secretory calcifications. Stability is not known. Mammographic
images were processed with CAD.

LEFT BREAST:

Mammogram: Postoperative changes are seen in the UPPER portion of
the LEFT breast. A group of microcalcifications in the LATERAL
portion of the LEFT breast is further evaluated with magnified
views. These views show a 0.3 centimeter group of linear and round
calcifications 3 centimeters LATERAL to the lumpectomy scar.
Stability is not known. Mammographic images were processed with CAD.
IMPRESSION: 1. Calcifications in the central RIGHT breast are favored to be
benign. Comparison prior studies is needed to assess stability.
2. 0.3 centimeter group of indeterminate calcifications in the
LATERAL portion of the LEFT breast. Comparison with prior studies is
needed to assess stability.

RECOMMENDATION:
Recommend comparison with prior studies when available.

I will call the patient with results. We discussed the possibility
of biopsy if prior studies cannot be obtained, or if there has been
a change since prior studies.

I have discussed the findings and recommendations with the patient.
If applicable, a reminder letter will be sent to the patient
regarding the next appointment.

BI-RADS CATEGORY  0: Incomplete. Need additional imaging evaluation
and/or prior mammograms for comparison.

## 2023-02-01 DIAGNOSIS — M79672 Pain in left foot: Secondary | ICD-10-CM | POA: Diagnosis not present

## 2023-02-08 ENCOUNTER — Ambulatory Visit
Admission: RE | Admit: 2023-02-08 | Discharge: 2023-02-08 | Disposition: A | Discharge status: Home / Self Care | Payer: BC Managed Care – PPO | Source: Ambulatory Visit | Attending: Hematology | Admitting: Hematology

## 2023-02-08 ENCOUNTER — Telehealth: Payer: Self-pay

## 2023-02-08 ENCOUNTER — Other Ambulatory Visit: Payer: Self-pay | Admitting: Hematology

## 2023-02-08 ENCOUNTER — Other Ambulatory Visit: Payer: Self-pay

## 2023-02-08 DIAGNOSIS — R921 Mammographic calcification found on diagnostic imaging of breast: Secondary | ICD-10-CM | POA: Diagnosis not present

## 2023-02-08 DIAGNOSIS — C50412 Malignant neoplasm of upper-outer quadrant of left female breast: Secondary | ICD-10-CM

## 2023-02-08 MED ORDER — ANASTROZOLE 1 MG PO TABS: ORAL_TABLET | ORAL | 0 refills | Status: DC

## 2023-02-08 NOTE — Telephone Encounter (Signed)
 Pt called stating she needs a refill on the Anastrozole .  Stated that Dr. Demetra office can refill a 30-day supply at this time but pt will need a f/u appt with Dr. Lanny and her Team.  Pt stated the appt she had scheduled in January 2025 were cancelled d/t pt had not completed the ordered mammogram and DEXA.  Pt stated she has completed them now and would like to get rescheduled to see Dr. Lanny and her Team.  Transferred pt to Scheduling Team to schedule her appt.  No refill will be provided until f/u appt is completed at which time the provider will refill for the 90 day supply the pt is requesting.  A refill for 30 day supply of anastrozole  was done today 02/08/2023.

## 2023-02-09 ENCOUNTER — Telehealth: Payer: Self-pay | Admitting: Hematology

## 2023-02-11 NOTE — Assessment & Plan Note (Signed)
Stage IA, p(T1bN0M0), ER+/PR+/HER2-, Grade I  -Diagnosed in 01/2020. S/p left lumpectomy by Dr Johny Blamer in [place] on 02/06/20 and re-excision on 02/12/20 for close margin and hematoma evacuation. --She received adjuvant radiation with Dr Isidore Moos 03/25/20-04/21/20 -She began anastrozole in 05/2020, tolerating well with mild hot flashes/night sweats.

## 2023-02-12 ENCOUNTER — Inpatient Hospital Stay: Payer: BC Managed Care – PPO | Attending: Hematology

## 2023-02-12 ENCOUNTER — Inpatient Hospital Stay: Payer: BC Managed Care – PPO | Admitting: Hematology

## 2023-02-12 VITALS — BP 160/89 | HR 106 | Temp 97.8°F | Resp 19 | Ht 63.0 in | Wt 178.6 lb

## 2023-02-12 DIAGNOSIS — Z79811 Long term (current) use of aromatase inhibitors: Secondary | ICD-10-CM | POA: Diagnosis not present

## 2023-02-12 DIAGNOSIS — C50412 Malignant neoplasm of upper-outer quadrant of left female breast: Secondary | ICD-10-CM

## 2023-02-12 DIAGNOSIS — Z170 Estrogen receptor positive status [ER+]: Secondary | ICD-10-CM

## 2023-02-12 DIAGNOSIS — Z1231 Encounter for screening mammogram for malignant neoplasm of breast: Secondary | ICD-10-CM

## 2023-02-12 LAB — CBC WITH DIFFERENTIAL (CANCER CENTER ONLY)
Abs Immature Granulocytes: 0.02 10*3/uL (ref 0.00–0.07)
Basophils Absolute: 0.1 10*3/uL (ref 0.0–0.1)
Basophils Relative: 1 %
Eosinophils Absolute: 0.1 10*3/uL (ref 0.0–0.5)
Eosinophils Relative: 1 %
HCT: 44 % (ref 36.0–46.0)
Hemoglobin: 15.3 g/dL — ABNORMAL HIGH (ref 12.0–15.0)
Immature Granulocytes: 0 %
Lymphocytes Relative: 26 %
Lymphs Abs: 2 10*3/uL (ref 0.7–4.0)
MCH: 32.7 pg (ref 26.0–34.0)
MCHC: 34.8 g/dL (ref 30.0–36.0)
MCV: 94 fL (ref 80.0–100.0)
Monocytes Absolute: 0.7 10*3/uL (ref 0.1–1.0)
Monocytes Relative: 9 %
Neutro Abs: 4.7 10*3/uL (ref 1.7–7.7)
Neutrophils Relative %: 63 %
Platelet Count: 245 10*3/uL (ref 150–400)
RBC: 4.68 MIL/uL (ref 3.87–5.11)
RDW: 12.4 % (ref 11.5–15.5)
WBC Count: 7.4 10*3/uL (ref 4.0–10.5)
nRBC: 0 % (ref 0.0–0.2)

## 2023-02-12 LAB — CMP (CANCER CENTER ONLY)
ALT: 46 U/L — ABNORMAL HIGH (ref 0–44)
AST: 51 U/L — ABNORMAL HIGH (ref 15–41)
Albumin: 4.3 g/dL (ref 3.5–5.0)
Alkaline Phosphatase: 75 U/L (ref 38–126)
Anion gap: 8 (ref 5–15)
BUN: 19 mg/dL (ref 8–23)
CO2: 29 mmol/L (ref 22–32)
Calcium: 10 mg/dL (ref 8.9–10.3)
Chloride: 100 mmol/L (ref 98–111)
Creatinine: 0.89 mg/dL (ref 0.44–1.00)
GFR, Estimated: >60 mL/min (ref >60)
Glucose, Bld: 151 mg/dL — ABNORMAL HIGH (ref 70–99)
Potassium: 3.9 mmol/L (ref 3.5–5.1)
Sodium: 137 mmol/L (ref 135–145)
Total Bilirubin: 0.6 mg/dL (ref 0.0–1.2)
Total Protein: 7.7 g/dL (ref 6.5–8.1)

## 2023-02-12 MED ORDER — ANASTROZOLE 1 MG PO TABS: ORAL_TABLET | ORAL | 3 refills | Status: AC

## 2023-02-12 NOTE — Progress Notes (Signed)
 [facility]   [phone] [phone]   Clinic Follow up Note   Patient Care Team: Adela Holter, DO as PCP - General (Family Medicine) Sonja Taylor, MD as Consulting Physician (Hematology) Colie Dawes, MD as Attending Physician (Radiation Oncology) Burton, Lacie K, NP as Nurse Practitioner (Nurse Practitioner)  Date of Service:  02/12/2023  CHIEF COMPLAINT: f/u of breast cancer  CURRENT THERAPY:  Anastrozole   Oncology History   Carcinoma of upper-outer quadrant of left breast in female, estrogen receptor positive (HCC) Stage IA, p(T1bN0M0), ER+/PR+/HER2-, Grade I  -Diagnosed in 01/2020. S/p left lumpectomy by Dr Melvin Staff in [place]  on 02/06/20 and re-excision on 02/12/20 for close margin and hematoma evacuation. --She received adjuvant radiation with Dr Lurena Sally 03/25/20-04/21/20 -She began anastrozole  in 05/2020, tolerating well with mild hot flashes/night sweats.  Assessment and Plan    Breast Cancer (Post-Treatment Follow-Up) Follow-up for breast cancer diagnosed in 2022. Completed radiation therapy and on anastrozole  since May 2022. Recent mammogram showed stable calcifications. No significant discomfort or swelling; physical exam shows well-healed incisions with minimal scar tissue. No signs of recurrence. Discussed continuing anastrozole  and regular mammograms. Patient concerned about memory issues possibly related to anastrozole . - Continue anastrozole  - Renew anastrozole  prescription for 90 days - Order annual mammogram - Follow up in one year  Osteopenia Low estimated fracture risk (8.5% over ten years). Advised against calcium supplementation due to occasional hypercalcemia. Vitamin D supplementation causes constipation. Discussed benefits of calcium and vitamin D supplementation and weight-bearing exercise for bone health. - Recommend calcium 500-600 mg with vitamin D - Encourage weight-bearing exercise and outdoor walking - Repeat bone  density test in two years  Hypertension Intermittent elevated blood pressure readings, likely due to anxiety during visits. Home readings are normal. Primary care follow-up indicates well-managed condition. - Continue current blood pressure management - Follow up with primary care physician as scheduled  General Health Maintenance Due for annual check-up with primary care physician in June. - Schedule annual check-up with primary care physician in June  Plan -Lab, recent mammogram and bone density scan reviewed and discussed with patient -Continue adjuvant anastrozole  - Follow up in one year for breast cancer surveillance - Repeat bone density test in two years.         SUMMARY OF ONCOLOGIC HISTORY: Oncology History Overview Note  Cancer Staging Carcinoma of upper-outer quadrant of left breast in female, estrogen receptor positive (HCC) Staging form: Breast, AJCC 8th Edition - Pathologic stage from 03/08/2020: Stage IA (pT1b, pN0, cM0, G1, ER+, PR+, HER2-) - Signed by Colie Dawes, MD on 03/08/2020 Stage prefix: Initial diagnosis Histologic grading system: 3 grade system    Carcinoma of upper-outer quadrant of left breast in female, estrogen receptor positive (HCC)  12/08/2019 Breast US    Left breast US  12/08/19 Findings:  At the left 12:00 position 7cmfn there is a small mass 1.2x1x0.7cm with spiculated margins and associated calcifications.    01/25/2020 Breast MRI   MRI Breast 01/25/20 Impression Left bresat with lobular enhanceing mass 11:00 position, 6.3cm from nipple, measuring 0.8x1.4x1cm. This is suspicious.  There is linear ductal enhanement extending posteriorly 1.1cm and 2 areas of linear non-mass enhancent extending exteriorly measuring 0.8 and 3.1cm in length. There may represent insitue cancer.    01/28/2020 Initial Biopsy   OUTSIDE SCAN   02/06/2020 Surgery   Left Lumpectomy by Dr Iverson Market     02/12/2020 Surgery   Left breast re-excision surgery by Dr  Iverson Market     03/08/2020 Initial Diagnosis  Carcinoma of upper-outer quadrant of left breast in female, estrogen receptor positive (HCC)   03/08/2020 Cancer Staging   Staging form: Breast, AJCC 8th Edition - Pathologic stage from 03/08/2020: Stage IA (pT1b, pN0, cM0, G1, ER+, PR+, HER2-) - Signed by Colie Dawes, MD on 03/08/2020 Stage prefix: Initial diagnosis Histologic grading system: 3 grade system   03/25/2020 - 04/21/2020 Radiation Therapy   Adjuvant radiation with Dr Lurena Sally    05/2020 -  Anti-estrogen oral therapy   Anastrozole  1mg  daily starting in 05/2020   08/17/2020 Survivorship   SCP delivered via virtual encounter by Lacie Burton, NP    Genetic Testing   [facility] Multi-Cancer Panel+RNA was Negative. Report date is 03/16/2022.  The Multi-Cancer + RNA Panel offered by [facility] includes sequencing and/or deletion/duplication analysis of the following 70 genes:  AIP*, ALK, APC*, ATM*, AXIN2*, BAP1*, BARD1*, BLM*, BMPR1A*, BRCA1*, BRCA2*, BRIP1*, CDC73*, CDH1*, CDK4, CDKN1B*, CDKN2A, CHEK2*, CTNNA1*, DICER1*, EPCAM (del/dup only), EGFR, FH*, FLCN*, GREM1 (promoter dup only), HOXB13, KIT, LZTR1, MAX*, MBD4, MEN1*, MET, MITF, MLH1*, MSH2*, MSH3*, MSH6*, MUTYH*, NF1*, NF2*, NTHL1*, PALB2*, PDGFRA, PMS2*, POLD1*, POLE*, POT1*, PRKAR1A*, PTCH1*, PTEN*, RAD51C*, RAD51D*, RB1*, RET, SDHA* (sequencing only), SDHAF2*, SDHB*, SDHC*, SDHD*, SMAD4*, SMARCA4*, SMARCB1*, SMARCE1*, STK11*, SUFU*, TMEM127*, TP53*, TSC1*, TSC2*, VHL*. RNA analysis is performed for * genes.      Discussed the use of AI scribe software for clinical note transcription with the patient, who gave verbal consent to proceed.  History of Present Illness   The patient, with a history of breast cancer diagnosed in 2022, presents for a routine follow-up. She reports that her recent mammogram showed no changes in calcifications and she is scheduled for a regular mammogram in a year. She experiences occasional discomfort in the breast  where she had surgery, but denies any swelling. She also mentions having osteopenia and is advised to take calcium and vitamin D. She is currently on anastrozole  and requests a renewal of her prescription. She has slightly elevated liver enzymes, but this has been a consistent finding in her previous blood work.         All other systems were reviewed with the patient and are negative.  MEDICAL HISTORY:  Past Medical History:  Diagnosis Date   Ectopic pregnancy    left   Hormone disorder    HYPOTHYROIDISM   Hypertension    Missed ab    FIRST TRIMESTER   Personal history of radiation therapy    Thyroid  disease    Toxemia     SURGICAL HISTORY: Past Surgical History:  Procedure Laterality Date   BREAST LUMPECTOMY Left 02/06/2020   re-excision a week later   CESAREAN SECTION     DILATION AND CURETTAGE OF UTERUS     ENTEROLYSIS  1988   SBO   SALPINGECTOMY  1987   left   TUBAL LIGATION      I have reviewed the social history and family history with the patient and they are unchanged from previous note.  ALLERGIES:  has no known allergies.  MEDICATIONS:  Current Outpatient Medications  Medication Sig Dispense Refill   amLODipine  (NORVASC ) 5 MG tablet Take 1 tablet (5 mg total) by mouth daily. 90 tablet 1   anastrozole  (ARIMIDEX ) 1 MG tablet TAKE 1 TABLET(1 MG) BY MOUTH DAILY 90 tablet 3   carvedilol  (COREG ) 12.5 MG tablet TAKE 1 TABLET(12.5 MG) BY MOUTH TWICE DAILY WITH A MEAL 180 tablet 1   hydrochlorothiazide  (HYDRODIURIL ) 25 MG tablet Take 1 tablet (25 mg total)  by mouth daily. 90 tablet 1   levothyroxine  (SYNTHROID ) 75 MCG tablet Take 1 tablet (75 mcg total) by mouth daily. 90 tablet 1   No current facility-administered medications for this visit.    PHYSICAL EXAMINATION: ECOG PERFORMANCE STATUS: 0 - Asymptomatic  Vitals:   02/12/23 1502  BP: (!) 160/89  Pulse: (!) 106  Resp: 19  Temp: 97.8 F (36.6 C)  SpO2: 98%   Wt Readings from Last 3 Encounters:   02/12/23 81 kg  12/12/22 79.8 kg  07/12/22 78.1 kg     GENERAL:alert, no distress and comfortable SKIN: skin color, texture, turgor are normal, no rashes or significant lesions EYES: normal, Conjunctiva are pink and non-injected, sclera clear NECK: supple, thyroid  normal size, non-tender, without nodularity LYMPH:  no palpable lymphadenopathy in the cervical, axillary  LUNGS: clear to auscultation and percussion with normal breathing effort HEART: regular rate & rhythm and no murmurs and no lower extremity edema ABDOMEN:abdomen soft, non-tender and normal bowel sounds Musculoskeletal:no cyanosis of digits and no clubbing  NEURO: alert & oriented x 3 with fluent speech, no focal motor/sensory deficits BREAST: Left breast with surgical incision healed well, no significant scar tenderness. Right breast unremarkable.  No axillary adenopathy     LABORATORY DATA:  I have reviewed the data as listed    Latest Ref Rng & Units 02/12/2023    2:28 PM 06/15/2022    8:17 AM 03/07/2022    9:46 AM  CBC  WBC 4.0 - 10.5 K/uL 7.4  4.8  5.9   Hemoglobin 12.0 - 15.0 g/dL 62.1  30.8  65.7   Hematocrit 36.0 - 46.0 % 44.0  44.5  42.5   Platelets 150 - 400 K/uL 245  237  248         Latest Ref Rng & Units 02/12/2023    2:28 PM 06/15/2022    8:17 AM 03/07/2022    9:46 AM  CMP  Glucose 70 - 99 mg/dL 846  962  952   BUN 8 - 23 mg/dL 19  11  16    Creatinine 0.44 - 1.00 mg/dL 8.41  3.24  4.01   Sodium 135 - 145 mmol/L 137  140  141   Potassium 3.5 - 5.1 mmol/L 3.9  4.5  4.0   Chloride 98 - 111 mmol/L 100  101  103   CO2 22 - 32 mmol/L 29  28  30    Calcium 8.9 - 10.3 mg/dL 02.7  25.3  66.4   Total Protein 6.5 - 8.1 g/dL 7.7  7.4  8.0   Total Bilirubin 0.0 - 1.2 mg/dL 0.6  0.5  0.4   Alkaline Phos 38 - 126 U/L 75   66   AST 15 - 41 U/L 51  28  37   ALT 0 - 44 U/L 46  26  32       RADIOGRAPHIC STUDIES: I have personally reviewed the radiological images as listed and agreed with the findings in the  report. No results found.    Orders Placed This Encounter  Procedures   MM DIGITAL SCREENING BILATERAL    Standing Status:   Future    Expected Date:   02/11/2024    Expiration Date:   02/12/2024    Reason for Exam (SYMPTOM  OR DIAGNOSIS REQUIRED):   SCREENING    Preferred imaging location?:   [facility]   All questions were answered. The patient knows to call the clinic with any problems, questions or concerns.  No barriers to learning was detected. The total time spent in the appointment was 25 minutes.     Sonja Odem, MD 02/12/2023

## 2023-03-01 DIAGNOSIS — M79672 Pain in left foot: Secondary | ICD-10-CM | POA: Diagnosis not present

## 2023-03-14 ENCOUNTER — Other Ambulatory Visit: Payer: Self-pay | Admitting: Family Medicine

## 2023-03-14 ENCOUNTER — Telehealth: Payer: Self-pay | Admitting: Family Medicine

## 2023-03-14 DIAGNOSIS — E039 Hypothyroidism, unspecified: Secondary | ICD-10-CM

## 2023-03-14 NOTE — Telephone Encounter (Signed)
 Last Fill: 12/12/22  Last OV: 12/12/22 Next OV: None Scheduled  Routing to provider for review/authorization.

## 2023-03-14 NOTE — Telephone Encounter (Signed)
 Per chart review, prescription for levothyroxine does contain 1 refill. Patient will need to call pharmacy to request her refill.   Copied from CRM [id number]. Topic: Clinical - Medication Refill >> Mar 14, 2023  4:28 PM Geroge Baseman wrote: Most Recent Primary Care Visit:  Provider: Everrett Coombe  Department: [facility]  Visit Type: OFFICE VISIT  Date: 12/12/2022  Medication: levothyroxine (SYNTHROID) 75 MCG tablet  Has the patient contacted their pharmacy? Yes (Agent: If no, request that the patient contact the pharmacy for the refill. If patient does not wish to contact the pharmacy document the reason why and proceed with request.) (Agent: If yes, when and what did the pharmacy advise?)  Is this the correct pharmacy for this prescription? Yes If no, delete pharmacy and type the correct one.  This is the patient's preferred pharmacy:  [id number] - [place], [place] - [address] AT [facility] [address] [place] [address] Phone: [phone] Fax: [phone]    Has the prescription been filled recently? Yes  Is the patient out of the medication? Yes  Has the patient been seen for an appointment in the last year OR does the patient have an upcoming appointment? Yes  Can we respond through MyChart? No  Agent: Please be advised that Rx refills may take up to 3 business days. We ask that you follow-up with your pharmacy.

## 2023-03-14 NOTE — Telephone Encounter (Signed)
 Copied from CRM [id number]. Topic: Clinical - Medication Refill >> Mar 14, 2023  4:28 PM Geroge Baseman wrote: Most Recent Primary Care Visit:  Provider: Everrett Coombe  Department: [facility]  Visit Type: OFFICE VISIT  Date: 12/12/2022  Medication: levothyroxine (SYNTHROID) 75 MCG tablet  Has the patient contacted their pharmacy? Yes (Agent: If no, request that the patient contact the pharmacy for the refill. If patient does not wish to contact the pharmacy document the reason why and proceed with request.) (Agent: If yes, when and what did the pharmacy advise?)  Is this the correct pharmacy for this prescription? Yes If no, delete pharmacy and type the correct one.  This is the patient's preferred pharmacy:  [id number] - [place], [place] - [address] AT [facility] [address] [place] [address] Phone: [phone] Fax: [phone]    Has the prescription been filled recently? Yes  Is the patient out of the medication? Yes  Has the patient been seen for an appointment in the last year OR does the patient have an upcoming appointment? Yes  Can we respond through MyChart? No  Agent: Please be advised that Rx refills may take up to 3 business days. We ask that you follow-up with your pharmacy.

## 2023-03-16 NOTE — Telephone Encounter (Signed)
 Pharmacy does have the prescription from December. They will get the prescription ready for pick up. Patient is aware.

## 2023-03-29 DIAGNOSIS — M25511 Pain in right shoulder: Secondary | ICD-10-CM | POA: Diagnosis not present

## 2023-03-29 DIAGNOSIS — I10 Essential (primary) hypertension: Secondary | ICD-10-CM | POA: Diagnosis not present

## 2023-05-31 DIAGNOSIS — M25511 Pain in right shoulder: Secondary | ICD-10-CM | POA: Diagnosis not present

## 2023-06-03 ENCOUNTER — Other Ambulatory Visit: Payer: Self-pay | Admitting: Family Medicine

## 2023-06-03 DIAGNOSIS — I10 Essential (primary) hypertension: Secondary | ICD-10-CM

## 2023-06-12 ENCOUNTER — Ambulatory Visit: Payer: BC Managed Care – PPO | Admitting: Family Medicine

## 2023-06-14 ENCOUNTER — Ambulatory Visit: Admitting: Family Medicine

## 2023-06-27 ENCOUNTER — Ambulatory Visit: Admitting: Family Medicine

## 2023-06-27 ENCOUNTER — Encounter: Payer: Self-pay | Admitting: Family Medicine

## 2023-06-27 VITALS — BP 132/90 | HR 90 | Ht 63.0 in | Wt 176.0 lb

## 2023-06-27 DIAGNOSIS — Z13220 Encounter for screening for lipoid disorders: Secondary | ICD-10-CM | POA: Diagnosis not present

## 2023-06-27 DIAGNOSIS — E039 Hypothyroidism, unspecified: Secondary | ICD-10-CM | POA: Diagnosis not present

## 2023-06-27 DIAGNOSIS — I10 Essential (primary) hypertension: Secondary | ICD-10-CM

## 2023-06-27 DIAGNOSIS — C50412 Malignant neoplasm of upper-outer quadrant of left female breast: Secondary | ICD-10-CM

## 2023-06-27 DIAGNOSIS — Z170 Estrogen receptor positive status [ER+]: Secondary | ICD-10-CM

## 2023-06-27 MED ORDER — AMLODIPINE BESYLATE 5 MG PO TABS: ORAL_TABLET | ORAL | 1 refills | Status: DC

## 2023-06-27 MED ORDER — LEVOTHYROXINE SODIUM 75 MCG PO TABS: 75.0000 ug | ORAL_TABLET | Freq: Every day | ORAL | 1 refills | Status: DC

## 2023-06-27 MED ORDER — CARVEDILOL 12.5 MG PO TABS: ORAL_TABLET | ORAL | 1 refills | Status: DC

## 2023-06-27 MED ORDER — HYDROCHLOROTHIAZIDE 25 MG PO TABS: ORAL_TABLET | ORAL | 1 refills | Status: DC

## 2023-06-27 NOTE — Progress Notes (Signed)
 Margaret Best - 62 y.o. female MRN [id number]  Date of birth: September 19, 1960  Subjective Chief Complaint  Patient presents with   Hypertension    HPI Margaret Best is a 63 y.o. female here today for follow up visit.   Overall she is doing well.  She has bump on the back of the tongue.  Denies pain or irritation with this.  It has been present for a few months.    BP remains well controlled.  No side effects from medication.  Denies chest pain, shortness of breath, palpitations, headache or vision changes.    Taking levothyroxine  as directed.  Due for updated labs.   ROS:  A comprehensive ROS was completed and negative except as noted per HPI  No Known Allergies  Past Medical History:  Diagnosis Date   Ectopic pregnancy    left   GERD (gastroesophageal reflux disease)    Hormone disorder    HYPOTHYROIDISM   Hypertension    Missed ab    FIRST TRIMESTER   Personal history of radiation therapy    Thyroid  disease    Toxemia     Past Surgical History:  Procedure Laterality Date   BREAST LUMPECTOMY Left 02/06/2020   re-excision a week later   CESAREAN SECTION     DILATION AND CURETTAGE OF UTERUS     ENTEROLYSIS  1988   SBO   SALPINGECTOMY  1987   left   TUBAL LIGATION      Social History   Socioeconomic History   Marital status: Divorced    Spouse name: Not on file   Number of children: 2   Years of education: Not on file   Highest education level: Master's degree (e.g., MA, MS, MEng, MEd, MSW, MBA)  Occupational History   Not on file  Tobacco Use   Smoking status: Never   Smokeless tobacco: Never  Vaping Use   Vaping status: Never Used  Substance and Sexual Activity   Alcohol use: Yes    Alcohol/week: 5.0 standard drinks of alcohol    Types: 5 Glasses of wine per week   Drug use: No   Sexual activity: Not Currently    Comment: 1st intercourse 63 yo-More than 5 partners  Other Topics Concern   Not on file  Social History Narrative   Not on  file   Social Drivers of Health   Financial Resource Strain: Low Risk  (06/12/2022)   Overall Financial Resource Strain (CARDIA)    Difficulty of Paying Living Expenses: Not hard at all  Food Insecurity: No Food Insecurity (06/12/2022)   Hunger Vital Sign    Worried About Running Out of Food in the Last Year: Never true    Ran Out of Food in the Last Year: Never true  Transportation Needs: No Transportation Needs (06/12/2022)   PRAPARE - Administrator, Civil Service (Medical): No    Lack of Transportation (Non-Medical): No  Physical Activity: Unknown (06/12/2022)   Exercise Vital Sign    Days of Exercise per Week: Patient declined    Minutes of Exercise per Session: Not on file  Stress: No Stress Concern Present (06/12/2022)   [employer] of Occupational Health - Occupational Stress Questionnaire    Feeling of Stress : Not at all  Social Connections: Moderately Isolated (06/12/2022)   Social Connection and Isolation Panel    Frequency of Communication with Friends and Family: More than three times a week    Frequency of Social Gatherings with  Friends and Family: More than three times a week    Attends Religious Services: 1 to 4 times per year    Active Member of Clubs or Organizations: No    Attends Engineer, structural: Not on file    Marital Status: Divorced    Family History  Problem Relation Age of Onset   Hypertension Mother    Stroke Mother    Heart failure Mother    Diabetes Father    Breast cancer Niece        BRCA2+    Health Maintenance  Topic Date Due   HIV Screening  Never done   Hepatitis C Screening  Never done   Zoster Vaccines- Shingrix (1 of 2) Never done   Fecal DNA (Cologuard)  Never done   Cervical Cancer Screening (HPV/Pap Cotest)  07/07/2020   COVID-19 Vaccine (5 - 2024-25 season) 09/03/2022   INFLUENZA VACCINE  08/03/2023   DTaP/Tdap/Td (2 - Td or Tdap) 01/06/2025   MAMMOGRAM  02/07/2025   Hepatitis B Vaccines  Aged Out    HPV VACCINES  Aged Out   Meningococcal B Vaccine  Aged Out     ----------------------------------------------------------------------------------------------------------------------------------------------------------------------------------------------------------------- Physical Exam BP (!) 132/90 (BP Location: Right Arm, Patient Position: Sitting, Cuff Size: Normal)   Pulse 90   Ht 5' 3 (1.6 m)   Wt 176 lb (79.8 kg)   LMP 01/20/2012   SpO2 97%   BMI 31.18 kg/m   Physical Exam Constitutional:      Appearance: Normal appearance.   Eyes:     General: No scleral icterus.   Cardiovascular:     Rate and Rhythm: Normal rate and regular rhythm.  Pulmonary:     Effort: Pulmonary effort is normal.     Breath sounds: Normal breath sounds.   Musculoskeletal:     Cervical back: Neck supple.   Neurological:     General: No focal deficit present.     Mental Status: She is alert.   Psychiatric:        Mood and Affect: Mood normal.        Behavior: Behavior normal.     ------------------------------------------------------------------------------------------------------------------------------------------------------------------------------------------------------------------- Assessment and Plan  HTN (hypertension) Blood pressure is well-controlled with current medications.  Recommend continuation.  Hypothyroidism Doing well with current strength of levothyroxine .  Will plan to continue. Updated TSH.  Carcinoma of upper-outer quadrant of left breast in female, estrogen receptor positive (HCC) Remains on anastrazole.  Continues to see oncology.    Meds ordered this encounter  Medications   amLODipine  (NORVASC ) 5 MG tablet    Sig: TAKE 1 TABLET(5 MG) BY MOUTH DAILY    Dispense:  90 tablet    Refill:  1   carvedilol  (COREG ) 12.5 MG tablet    Sig: TAKE 1 TABLET(12.5 MG) BY MOUTH TWICE DAILY WITH A MEAL    Dispense:  180 tablet    Refill:  1   hydrochlorothiazide   (HYDRODIURIL ) 25 MG tablet    Sig: TAKE 1 TABLET(25 MG) BY MOUTH DAILY    Dispense:  90 tablet    Refill:  1   levothyroxine  (SYNTHROID ) 75 MCG tablet    Sig: Take 1 tablet (75 mcg total) by mouth daily.    Dispense:  90 tablet    Refill:  1    Return in about 6 months (around 12/27/2023) for Hypertension.

## 2023-06-27 NOTE — Assessment & Plan Note (Signed)
 Doing well with current strength of levothyroxine .  Will plan to continue. Updated TSH.

## 2023-06-27 NOTE — Assessment & Plan Note (Signed)
Blood pressure is well-controlled with current medications.  Recommend continuation.

## 2023-06-27 NOTE — Assessment & Plan Note (Signed)
 Remains on anastrazole.  Continues to see oncology.

## 2023-06-28 LAB — CBC WITH DIFFERENTIAL/PLATELET
Basophils Absolute: 0.1 10*3/uL (ref 0.0–0.2)
Basos: 1 %
EOS (ABSOLUTE): 0 10*3/uL (ref 0.0–0.4)
Eos: 1 %
Hematocrit: 46.9 % — ABNORMAL HIGH (ref 34.0–46.6)
Hemoglobin: 15.1 g/dL (ref 11.1–15.9)
Immature Grans (Abs): 0 10*3/uL (ref 0.0–0.1)
Immature Granulocytes: 0 %
Lymphocytes Absolute: 2 10*3/uL (ref 0.7–3.1)
Lymphs: 23 %
MCH: 32.5 pg (ref 26.6–33.0)
MCHC: 32.2 g/dL (ref 31.5–35.7)
MCV: 101 fL — ABNORMAL HIGH (ref 79–97)
Monocytes Absolute: 0.8 10*3/uL (ref 0.1–0.9)
Monocytes: 9 %
Neutrophils Absolute: 5.9 10*3/uL (ref 1.4–7.0)
Neutrophils: 66 %
Platelets: 213 10*3/uL (ref 150–450)
RBC: 4.65 x10E6/uL (ref 3.77–5.28)
RDW: 11.6 % — ABNORMAL LOW (ref 11.7–15.4)
WBC: 8.8 10*3/uL (ref 3.4–10.8)

## 2023-06-28 LAB — CMP14+EGFR
ALT: 55 IU/L — ABNORMAL HIGH (ref 0–32)
AST: 52 IU/L — ABNORMAL HIGH (ref 0–40)
Albumin: 4.5 g/dL (ref 3.9–4.9)
Alkaline Phosphatase: 82 IU/L (ref 44–121)
BUN/Creatinine Ratio: 18 (ref 12–28)
BUN: 15 mg/dL (ref 8–27)
Bilirubin Total: 0.6 mg/dL (ref 0.0–1.2)
CO2: 24 mmol/L (ref 20–29)
Calcium: 10.2 mg/dL (ref 8.7–10.3)
Chloride: 96 mmol/L (ref 96–106)
Creatinine, Ser: 0.84 mg/dL (ref 0.57–1.00)
Globulin, Total: 2.9 g/dL (ref 1.5–4.5)
Glucose: 123 mg/dL — ABNORMAL HIGH (ref 70–99)
Potassium: 4.4 mmol/L (ref 3.5–5.2)
Sodium: 137 mmol/L (ref 134–144)
Total Protein: 7.4 g/dL (ref 6.0–8.5)
eGFR: 78 mL/min/{1.73_m2} (ref >59)

## 2023-06-28 LAB — TSH: TSH: 1.51 u[IU]/mL (ref 0.450–4.500)

## 2023-06-28 LAB — LIPID PANEL WITH LDL/HDL RATIO
Cholesterol, Total: 209 mg/dL — ABNORMAL HIGH (ref 100–199)
HDL: 71 mg/dL (ref >39)
LDL Chol Calc (NIH): 122 mg/dL — ABNORMAL HIGH (ref 0–99)
LDL/HDL Ratio: 1.7 ratio (ref 0.0–3.2)
Triglycerides: 91 mg/dL (ref 0–149)
VLDL Cholesterol Cal: 16 mg/dL (ref 5–40)

## 2023-07-08 ENCOUNTER — Ambulatory Visit: Payer: Self-pay | Admitting: Family Medicine

## 2023-08-20 ENCOUNTER — Other Ambulatory Visit: Payer: Self-pay

## 2023-08-20 DIAGNOSIS — I10 Essential (primary) hypertension: Secondary | ICD-10-CM

## 2023-08-20 MED ORDER — CARVEDILOL 12.5 MG PO TABS: ORAL_TABLET | ORAL | 1 refills | Status: DC

## 2023-10-17 ENCOUNTER — Telehealth: Payer: Self-pay

## 2023-10-17 DIAGNOSIS — Z1211 Encounter for screening for malignant neoplasm of colon: Secondary | ICD-10-CM

## 2023-10-17 NOTE — Telephone Encounter (Signed)
 Jamel agreed to have a Cologuard. Order placed.

## 2023-12-09 ENCOUNTER — Other Ambulatory Visit: Payer: Self-pay | Admitting: Family Medicine

## 2023-12-09 DIAGNOSIS — I10 Essential (primary) hypertension: Secondary | ICD-10-CM

## 2023-12-20 ENCOUNTER — Encounter: Payer: Self-pay | Admitting: Family Medicine

## 2023-12-20 ENCOUNTER — Ambulatory Visit: Admitting: Family Medicine

## 2023-12-20 VITALS — BP 122/84 | HR 100 | Ht 63.0 in | Wt 175.0 lb

## 2023-12-20 DIAGNOSIS — E039 Hypothyroidism, unspecified: Secondary | ICD-10-CM | POA: Diagnosis not present

## 2023-12-20 DIAGNOSIS — D1030 Benign neoplasm of unspecified part of mouth: Secondary | ICD-10-CM | POA: Diagnosis not present

## 2023-12-20 DIAGNOSIS — I10 Essential (primary) hypertension: Secondary | ICD-10-CM | POA: Diagnosis not present

## 2023-12-20 DIAGNOSIS — Z170 Estrogen receptor positive status [ER+]: Secondary | ICD-10-CM

## 2023-12-20 DIAGNOSIS — C50412 Malignant neoplasm of upper-outer quadrant of left female breast: Secondary | ICD-10-CM | POA: Diagnosis not present

## 2023-12-20 MED ORDER — HYDROCHLOROTHIAZIDE 25 MG PO TABS: ORAL_TABLET | ORAL | 1 refills | Status: AC

## 2023-12-20 MED ORDER — LEVOTHYROXINE SODIUM 75 MCG PO TABS: 75.0000 ug | ORAL_TABLET | Freq: Every day | ORAL | 1 refills | Status: AC

## 2023-12-20 MED ORDER — CARVEDILOL 12.5 MG PO TABS: ORAL_TABLET | ORAL | 1 refills | Status: AC

## 2023-12-20 MED ORDER — AMLODIPINE BESYLATE 5 MG PO TABS: ORAL_TABLET | ORAL | 1 refills | Status: AC

## 2023-12-20 NOTE — Assessment & Plan Note (Signed)
 Remains on anastrazole.  Continues to see oncology.

## 2023-12-20 NOTE — Assessment & Plan Note (Signed)
Doing well with current strength of levothyroxine.  Will plan to continue.

## 2023-12-20 NOTE — Assessment & Plan Note (Signed)
Blood pressure is well-controlled with current medications.  Recommend continuation.

## 2023-12-20 NOTE — Progress Notes (Signed)
 Margaret Best - 63 y.o. female MRN [id number]  Date of birth: 1960-12-31  Subjective Chief Complaint  Patient presents with   Hypertension    HPI Margaret Best is a 62 y.o. female here today for follow up visit.   Reports that she is doing well.  Continues to see oncology for history of breast cancer.  Remains on anastrazole.  She remains on coreg , hydrochlorothiazide  and amlodipine  for management of HTN.  BP is mildly elevated on initial check..  She has not had chest pain, shortness of breath, palpitations, headache or vision changes.   Feels well at current strength of levothyroxine .    Has nodule on inside of R cheek.  Has had this for several months.  No bleeding but does have pain from time to time.   ROS:  A comprehensive ROS was completed and negative except as noted per HPI   Allergies[1]  Past Medical History:  Diagnosis Date   Ectopic pregnancy    left   GERD (gastroesophageal reflux disease)    Hormone disorder    HYPOTHYROIDISM   Hypertension    Missed ab    FIRST TRIMESTER   Personal history of radiation therapy    Thyroid  disease    Toxemia     Past Surgical History:  Procedure Laterality Date   BREAST LUMPECTOMY Left 02/06/2020   re-excision a week later   CESAREAN SECTION     DILATION AND CURETTAGE OF UTERUS     ENTEROLYSIS  1988   SBO   SALPINGECTOMY  1987   left   TUBAL LIGATION      Social History   Socioeconomic History   Marital status: Divorced    Spouse name: Not on file   Number of children: 2   Years of education: Not on file   Highest education level: Master's degree (e.g., MA, MS, MEng, MEd, MSW, MBA)  Occupational History   Not on file  Tobacco Use   Smoking status: Never   Smokeless tobacco: Never  Vaping Use   Vaping status: Never Used  Substance and Sexual Activity   Alcohol use: Yes    Alcohol/week: 5.0 standard drinks of alcohol    Types: 5 Glasses of wine per week   Drug use: No   Sexual activity:  Not Currently    Comment: 1st intercourse 63 yo-More than 5 partners  Other Topics Concern   Not on file  Social History Narrative   Not on file   Social Drivers of Health   Tobacco Use: Low Risk (12/20/2023)   Patient History    Smoking Tobacco Use: Never    Smokeless Tobacco Use: Never    Passive Exposure: Not on file  Financial Resource Strain: Low Risk (06/27/2023)   Overall Financial Resource Strain (CARDIA)    Difficulty of Paying Living Expenses: Not hard at all  Food Insecurity: No Food Insecurity (06/27/2023)   Epic    Worried About Radiation Protection Practitioner of Food in the Last Year: Never true    Ran Out of Food in the Last Year: Never true  Transportation Needs: No Transportation Needs (06/12/2022)   PRAPARE - Administrator, Civil Service (Medical): No    Lack of Transportation (Non-Medical): No  Physical Activity: Patient Declined (06/27/2023)   Exercise Vital Sign    Days of Exercise per Week: Patient declined    Minutes of Exercise per Session: Patient declined  Stress: No Stress Concern Present (06/27/2023)   [employer] of Occupational  Health - Occupational Stress Questionnaire    Feeling of Stress: Not at all  Social Connections: Moderately Isolated (06/27/2023)   Social Connection and Isolation Panel    Frequency of Communication with Friends and Family: More than three times a week    Frequency of Social Gatherings with Friends and Family: More than three times a week    Attends Religious Services: 1 to 4 times per year    Active Member of Clubs or Organizations: Patient declined    Attends Banker Meetings: Never    Marital Status: Divorced  Depression (PHQ2-9): Low Risk (12/20/2023)   Depression (PHQ2-9)    PHQ-2 Score: 0  Alcohol Screen: Low Risk (06/27/2023)   Alcohol Screen    Last Alcohol Screening Score (AUDIT): 0  Housing: Low Risk (06/27/2023)   Epic    Unable to Pay for Housing in the Last Year: No    Number of Times Moved in the  Last Year: 0    Homeless in the Last Year: No  Utilities: Not At Risk (06/27/2023)   Epic    Threatened with loss of utilities: No  Health Literacy: Adequate Health Literacy (06/27/2023)   B1300 Health Literacy    Frequency of need for help with medical instructions: Never    Family History  Problem Relation Age of Onset   Hypertension Mother    Stroke Mother    Heart failure Mother    Diabetes Father    Breast cancer Niece        BRCA2+    Health Maintenance  Topic Date Due   HIV Screening  Never done   Hepatitis C Screening  Never done   Fecal DNA (Cologuard)  Never done   Cervical Cancer Screening (HPV/Pap Cotest)  07/07/2020   Mammogram  02/08/2024   Zoster Vaccines- Shingrix (1 of 2) 03/19/2024 (Originally [birth year]/1979)   Influenza Vaccine  04/01/2024 (Originally 08/03/2023)   COVID-19 Vaccine (5 - 2025-26 season) 09/01/2024 (Originally 09/03/2023)   Pneumococcal Vaccine: 50+ Years (1 of 1 - PCV) 12/19/2024 (Originally 01/06/2010)   DTaP/Tdap/Td (2 - Td or Tdap) 01/06/2025   Bone Density Scan  01/17/2025   Hepatitis B Vaccines 19-59 Average Risk  Aged Out   HPV VACCINES  Aged Out   Meningococcal B Vaccine  Aged Out     ----------------------------------------------------------------------------------------------------------------------------------------------------------------------------------------------------------------- Physical Exam BP (!) 138/90 (BP Location: Right Arm, Patient Position: Sitting, Cuff Size: Normal)   Pulse 100   Ht 5' 3 (1.6 m)   Wt 175 lb (79.4 kg)   LMP 01/20/2012   SpO2 96%   BMI 31.00 kg/m   Physical Exam Constitutional:      Appearance: Normal appearance.  Eyes:     General: No scleral icterus. Cardiovascular:     Rate and Rhythm: Normal rate and regular rhythm.  Pulmonary:     Effort: Pulmonary effort is normal.     Breath sounds: Normal breath sounds.  Neurological:     Mental Status: She is alert.  Psychiatric:        Mood and  Affect: Mood normal.        Behavior: Behavior normal.     ------------------------------------------------------------------------------------------------------------------------------------------------------------------------------------------------------------------- Assessment and Plan  HTN (hypertension) Blood pressure is well-controlled with current medications.  Recommend continuation.  Hypothyroidism Doing well with current strength of levothyroxine .  Will plan to continue.   Carcinoma of upper-outer quadrant of left breast in female, estrogen receptor positive (HCC) Remains on anastrazole.  Continues to see oncology.   Papilloma of  oral cavity Referral to oral surgery.    Meds ordered this encounter  Medications   amLODipine  (NORVASC ) 5 MG tablet    Sig: TAKE 1 TABLET(5 MG) BY MOUTH DAILY    Dispense:  90 tablet    Refill:  1   carvedilol  (COREG ) 12.5 MG tablet    Sig: TAKE 1 TABLET(12.5 MG) BY MOUTH TWICE DAILY WITH A MEAL    Dispense:  180 tablet    Refill:  1   hydrochlorothiazide  (HYDRODIURIL ) 25 MG tablet    Sig: TAKE 1 TABLET(25 MG) BY MOUTH DAILY    Dispense:  90 tablet    Refill:  1   levothyroxine  (SYNTHROID ) 75 MCG tablet    Sig: Take 1 tablet (75 mcg total) by mouth daily.    Dispense:  90 tablet    Refill:  1    Return in about 6 months (around 06/19/2024) for Hypertension.        [1] No Known Allergies

## 2023-12-20 NOTE — Assessment & Plan Note (Signed)
Referral to oral surgery.

## 2024-01-29 ENCOUNTER — Telehealth: Payer: Self-pay | Admitting: Hematology

## 2024-01-29 NOTE — Telephone Encounter (Signed)
 Left VM

## 2024-02-13 ENCOUNTER — Inpatient Hospital Stay

## 2024-02-13 ENCOUNTER — Ambulatory Visit: Payer: BC Managed Care – PPO | Admitting: Hematology

## 2024-02-13 ENCOUNTER — Inpatient Hospital Stay: Admitting: Hematology

## 2024-02-13 ENCOUNTER — Other Ambulatory Visit: Payer: BC Managed Care – PPO

## 2024-06-19 ENCOUNTER — Ambulatory Visit: Admitting: Family Medicine
